# Patient Record
Sex: Female | Born: 1955 | Race: White | Hispanic: No | Marital: Married | Smoking: Former smoker
Health system: Southern US, Community
[De-identification: ages and names within clinical notes are randomized; demographics above are authoritative.]

## PROBLEM LIST (undated history)

## (undated) DIAGNOSIS — I10 Essential (primary) hypertension: Secondary | ICD-10-CM

## (undated) DIAGNOSIS — E039 Hypothyroidism, unspecified: Secondary | ICD-10-CM

## (undated) DIAGNOSIS — I700 Atherosclerosis of aorta: Secondary | ICD-10-CM

## (undated) DIAGNOSIS — D563 Thalassemia minor: Secondary | ICD-10-CM

## (undated) DIAGNOSIS — J449 Chronic obstructive pulmonary disease, unspecified: Secondary | ICD-10-CM

## (undated) DIAGNOSIS — N390 Urinary tract infection, site not specified: Secondary | ICD-10-CM

## (undated) DIAGNOSIS — D649 Anemia, unspecified: Secondary | ICD-10-CM

## (undated) DIAGNOSIS — G2581 Restless legs syndrome: Secondary | ICD-10-CM

## (undated) DIAGNOSIS — N951 Menopausal and female climacteric states: Secondary | ICD-10-CM

## (undated) DIAGNOSIS — G4700 Insomnia, unspecified: Secondary | ICD-10-CM

## (undated) DIAGNOSIS — F1310 Sedative, hypnotic or anxiolytic abuse, uncomplicated: Secondary | ICD-10-CM

## (undated) DIAGNOSIS — F32A Depression, unspecified: Secondary | ICD-10-CM

## (undated) DIAGNOSIS — F329 Major depressive disorder, single episode, unspecified: Secondary | ICD-10-CM

## (undated) DIAGNOSIS — C4492 Squamous cell carcinoma of skin, unspecified: Secondary | ICD-10-CM

## (undated) HISTORY — DX: Atherosclerosis of aorta: I70.0

## (undated) HISTORY — DX: Hypothyroidism, unspecified: E03.9

## (undated) HISTORY — DX: Menopausal and female climacteric states: N95.1

## (undated) HISTORY — DX: Chronic obstructive pulmonary disease, unspecified: J44.9

## (undated) HISTORY — DX: Anemia, unspecified: D64.9

## (undated) HISTORY — PX: OTHER SURGICAL HISTORY: SHX169

## (undated) HISTORY — DX: Essential (primary) hypertension: I10

## (undated) HISTORY — DX: Restless legs syndrome: G25.81

## (undated) HISTORY — DX: Sedative, hypnotic or anxiolytic abuse, uncomplicated: F13.10

## (undated) HISTORY — DX: Urinary tract infection, site not specified: N39.0

## (undated) HISTORY — DX: Depression, unspecified: F32.A

## (undated) HISTORY — DX: Thalassemia minor: D56.3

## (undated) HISTORY — PX: COLONOSCOPY: SHX174

## (undated) HISTORY — DX: Insomnia, unspecified: G47.00

## (undated) HISTORY — DX: Squamous cell carcinoma of skin, unspecified: C44.92

---
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
## 1998-06-25 ENCOUNTER — Other Ambulatory Visit: Admission: RE | Admit: 1998-06-25 | Discharge: 1998-06-25 | Payer: Self-pay | Admitting: Obstetrics & Gynecology

## 1999-01-07 ENCOUNTER — Other Ambulatory Visit: Admission: RE | Admit: 1999-01-07 | Discharge: 1999-01-07 | Payer: Self-pay | Admitting: Obstetrics & Gynecology

## 2000-02-04 ENCOUNTER — Other Ambulatory Visit: Admission: RE | Admit: 2000-02-04 | Discharge: 2000-02-04 | Payer: Self-pay | Admitting: Obstetrics & Gynecology

## 2000-09-01 ENCOUNTER — Other Ambulatory Visit: Admission: RE | Admit: 2000-09-01 | Discharge: 2000-09-01 | Payer: Self-pay | Admitting: Obstetrics & Gynecology

## 2001-02-03 ENCOUNTER — Other Ambulatory Visit: Admission: RE | Admit: 2001-02-03 | Discharge: 2001-02-03 | Payer: Self-pay | Admitting: Obstetrics & Gynecology

## 2001-08-06 ENCOUNTER — Other Ambulatory Visit: Admission: RE | Admit: 2001-08-06 | Discharge: 2001-08-06 | Payer: Self-pay | Admitting: Obstetrics & Gynecology

## 2002-08-23 ENCOUNTER — Other Ambulatory Visit: Admission: RE | Admit: 2002-08-23 | Discharge: 2002-08-23 | Payer: Self-pay | Admitting: Obstetrics & Gynecology

## 2003-09-19 ENCOUNTER — Other Ambulatory Visit: Admission: RE | Admit: 2003-09-19 | Discharge: 2003-09-19 | Payer: Self-pay | Admitting: Obstetrics & Gynecology

## 2004-11-27 ENCOUNTER — Other Ambulatory Visit: Admission: RE | Admit: 2004-11-27 | Discharge: 2004-11-27 | Payer: Self-pay | Admitting: Obstetrics & Gynecology

## 2005-12-18 ENCOUNTER — Other Ambulatory Visit: Admission: RE | Admit: 2005-12-18 | Discharge: 2005-12-18 | Payer: Self-pay | Admitting: Obstetrics & Gynecology

## 2006-08-25 ENCOUNTER — Encounter: Admission: RE | Admit: 2006-08-25 | Discharge: 2006-08-25 | Payer: Self-pay | Admitting: Obstetrics & Gynecology

## 2006-11-25 ENCOUNTER — Encounter: Admission: RE | Admit: 2006-11-25 | Discharge: 2006-11-25 | Payer: Self-pay | Admitting: Obstetrics & Gynecology

## 2007-11-29 ENCOUNTER — Encounter: Admission: RE | Admit: 2007-11-29 | Discharge: 2007-11-29 | Payer: Self-pay | Admitting: Obstetrics & Gynecology

## 2007-12-07 ENCOUNTER — Encounter: Admission: RE | Admit: 2007-12-07 | Discharge: 2007-12-07 | Payer: Self-pay | Admitting: Obstetrics & Gynecology

## 2008-12-07 ENCOUNTER — Encounter: Admission: RE | Admit: 2008-12-07 | Discharge: 2008-12-07 | Payer: Self-pay | Admitting: Obstetrics & Gynecology

## 2009-12-10 ENCOUNTER — Encounter: Admission: RE | Admit: 2009-12-10 | Discharge: 2009-12-10 | Payer: Self-pay | Admitting: Obstetrics & Gynecology

## 2010-10-27 ENCOUNTER — Encounter: Payer: Self-pay | Admitting: Obstetrics & Gynecology

## 2010-12-23 ENCOUNTER — Other Ambulatory Visit: Payer: Self-pay | Admitting: Obstetrics & Gynecology

## 2010-12-23 DIAGNOSIS — Z1231 Encounter for screening mammogram for malignant neoplasm of breast: Secondary | ICD-10-CM

## 2010-12-25 ENCOUNTER — Ambulatory Visit
Admission: RE | Admit: 2010-12-25 | Discharge: 2010-12-25 | Disposition: A | Discharge status: Home / Self Care | Payer: BC Managed Care – PPO | Source: Ambulatory Visit | Attending: Obstetrics & Gynecology | Admitting: Obstetrics & Gynecology

## 2010-12-25 DIAGNOSIS — Z1231 Encounter for screening mammogram for malignant neoplasm of breast: Secondary | ICD-10-CM

## 2010-12-27 ENCOUNTER — Other Ambulatory Visit: Payer: Self-pay | Admitting: Obstetrics & Gynecology

## 2010-12-27 DIAGNOSIS — R928 Other abnormal and inconclusive findings on diagnostic imaging of breast: Secondary | ICD-10-CM

## 2011-01-03 ENCOUNTER — Ambulatory Visit
Admission: RE | Admit: 2011-01-03 | Discharge: 2011-01-03 | Disposition: A | Discharge status: Home / Self Care | Payer: BC Managed Care – PPO | Source: Ambulatory Visit | Attending: Obstetrics & Gynecology | Admitting: Obstetrics & Gynecology

## 2011-01-03 DIAGNOSIS — R928 Other abnormal and inconclusive findings on diagnostic imaging of breast: Secondary | ICD-10-CM

## 2011-12-01 ENCOUNTER — Other Ambulatory Visit: Payer: Self-pay | Admitting: Obstetrics & Gynecology

## 2011-12-01 DIAGNOSIS — Z1231 Encounter for screening mammogram for malignant neoplasm of breast: Secondary | ICD-10-CM

## 2012-01-28 ENCOUNTER — Ambulatory Visit
Admission: RE | Admit: 2012-01-28 | Discharge: 2012-01-28 | Disposition: A | Discharge status: Home / Self Care | Payer: BC Managed Care – PPO | Source: Ambulatory Visit | Attending: Obstetrics & Gynecology | Admitting: Obstetrics & Gynecology

## 2012-01-28 DIAGNOSIS — Z1231 Encounter for screening mammogram for malignant neoplasm of breast: Secondary | ICD-10-CM

## 2013-01-03 ENCOUNTER — Other Ambulatory Visit: Payer: Self-pay

## 2013-01-03 DIAGNOSIS — Z1231 Encounter for screening mammogram for malignant neoplasm of breast: Secondary | ICD-10-CM

## 2013-01-28 ENCOUNTER — Ambulatory Visit
Admission: RE | Admit: 2013-01-28 | Discharge: 2013-01-28 | Disposition: A | Discharge status: Home / Self Care | Payer: BC Managed Care – PPO | Source: Ambulatory Visit

## 2013-01-28 DIAGNOSIS — Z1231 Encounter for screening mammogram for malignant neoplasm of breast: Secondary | ICD-10-CM

## 2013-04-19 DIAGNOSIS — C4491 Basal cell carcinoma of skin, unspecified: Secondary | ICD-10-CM

## 2013-04-19 HISTORY — DX: Basal cell carcinoma of skin, unspecified: C44.91

## 2013-09-13 ENCOUNTER — Other Ambulatory Visit: Payer: Self-pay | Admitting: Obstetrics & Gynecology

## 2013-09-13 DIAGNOSIS — N63 Unspecified lump in unspecified breast: Secondary | ICD-10-CM

## 2013-09-22 ENCOUNTER — Ambulatory Visit
Admission: RE | Admit: 2013-09-22 | Discharge: 2013-09-22 | Disposition: A | Discharge status: Home / Self Care | Payer: BC Managed Care – PPO | Source: Ambulatory Visit | Attending: Obstetrics & Gynecology | Admitting: Obstetrics & Gynecology

## 2013-09-22 DIAGNOSIS — N63 Unspecified lump in unspecified breast: Secondary | ICD-10-CM

## 2013-10-25 ENCOUNTER — Other Ambulatory Visit: Payer: Self-pay | Admitting: Obstetrics & Gynecology

## 2013-10-25 DIAGNOSIS — R922 Inconclusive mammogram: Secondary | ICD-10-CM

## 2013-10-25 DIAGNOSIS — Z803 Family history of malignant neoplasm of breast: Secondary | ICD-10-CM

## 2013-11-01 ENCOUNTER — Ambulatory Visit
Admission: RE | Admit: 2013-11-01 | Discharge: 2013-11-01 | Disposition: A | Discharge status: Home / Self Care | Payer: BC Managed Care – PPO | Source: Ambulatory Visit | Attending: Obstetrics & Gynecology | Admitting: Obstetrics & Gynecology

## 2013-11-01 DIAGNOSIS — R922 Inconclusive mammogram: Secondary | ICD-10-CM

## 2013-11-01 DIAGNOSIS — Z803 Family history of malignant neoplasm of breast: Secondary | ICD-10-CM

## 2013-11-01 MED ORDER — GADOBENATE DIMEGLUMINE 529 MG/ML IV SOLN
11.0000 mL | Freq: Once | INTRAVENOUS | Status: AC | PRN
  Administered 2013-11-01: 11 mL via INTRAVENOUS

## 2014-08-25 ENCOUNTER — Other Ambulatory Visit: Payer: Self-pay

## 2014-08-25 DIAGNOSIS — Z1231 Encounter for screening mammogram for malignant neoplasm of breast: Secondary | ICD-10-CM

## 2014-09-26 ENCOUNTER — Ambulatory Visit
Admission: RE | Admit: 2014-09-26 | Discharge: 2014-09-26 | Disposition: A | Discharge status: Home / Self Care | Payer: BC Managed Care – PPO | Source: Ambulatory Visit

## 2014-09-26 DIAGNOSIS — Z1231 Encounter for screening mammogram for malignant neoplasm of breast: Secondary | ICD-10-CM

## 2015-11-08 ENCOUNTER — Other Ambulatory Visit: Payer: Self-pay

## 2015-11-08 DIAGNOSIS — Z1231 Encounter for screening mammogram for malignant neoplasm of breast: Secondary | ICD-10-CM

## 2015-11-20 ENCOUNTER — Ambulatory Visit
Admission: RE | Admit: 2015-11-20 | Discharge: 2015-11-20 | Disposition: A | Discharge status: Home / Self Care | Payer: BLUE CROSS/BLUE SHIELD | Source: Ambulatory Visit

## 2015-11-20 DIAGNOSIS — Z1231 Encounter for screening mammogram for malignant neoplasm of breast: Secondary | ICD-10-CM

## 2016-08-22 ENCOUNTER — Other Ambulatory Visit: Payer: Self-pay | Admitting: Family Medicine

## 2016-08-22 ENCOUNTER — Ambulatory Visit
Admission: RE | Admit: 2016-08-22 | Discharge: 2016-08-22 | Disposition: A | Discharge status: Home / Self Care | Payer: BLUE CROSS/BLUE SHIELD | Source: Ambulatory Visit | Attending: Family Medicine | Admitting: Family Medicine

## 2016-08-22 DIAGNOSIS — R059 Cough, unspecified: Secondary | ICD-10-CM

## 2016-08-22 DIAGNOSIS — R05 Cough: Secondary | ICD-10-CM

## 2016-10-16 ENCOUNTER — Other Ambulatory Visit: Payer: Self-pay | Admitting: Obstetrics & Gynecology

## 2016-10-16 DIAGNOSIS — Z1231 Encounter for screening mammogram for malignant neoplasm of breast: Secondary | ICD-10-CM

## 2016-11-20 ENCOUNTER — Ambulatory Visit
Admission: RE | Admit: 2016-11-20 | Discharge: 2016-11-20 | Disposition: A | Discharge status: Home / Self Care | Payer: BLUE CROSS/BLUE SHIELD | Source: Ambulatory Visit | Attending: Obstetrics & Gynecology | Admitting: Obstetrics & Gynecology

## 2016-11-20 DIAGNOSIS — Z1231 Encounter for screening mammogram for malignant neoplasm of breast: Secondary | ICD-10-CM

## 2017-10-13 ENCOUNTER — Other Ambulatory Visit: Payer: Self-pay | Admitting: Obstetrics & Gynecology

## 2017-10-13 DIAGNOSIS — Z1231 Encounter for screening mammogram for malignant neoplasm of breast: Secondary | ICD-10-CM

## 2017-11-23 ENCOUNTER — Ambulatory Visit
Admission: RE | Admit: 2017-11-23 | Discharge: 2017-11-23 | Disposition: A | Discharge status: Home / Self Care | Payer: BLUE CROSS/BLUE SHIELD | Source: Ambulatory Visit | Attending: Obstetrics & Gynecology | Admitting: Obstetrics & Gynecology

## 2017-11-23 DIAGNOSIS — Z1231 Encounter for screening mammogram for malignant neoplasm of breast: Secondary | ICD-10-CM

## 2018-10-15 ENCOUNTER — Other Ambulatory Visit: Payer: Self-pay | Admitting: Obstetrics & Gynecology

## 2018-10-15 DIAGNOSIS — Z1231 Encounter for screening mammogram for malignant neoplasm of breast: Secondary | ICD-10-CM

## 2018-11-24 ENCOUNTER — Ambulatory Visit
Admission: RE | Admit: 2018-11-24 | Discharge: 2018-11-24 | Disposition: A | Discharge status: Home / Self Care | Payer: PRIVATE HEALTH INSURANCE | Source: Ambulatory Visit | Attending: Obstetrics & Gynecology | Admitting: Obstetrics & Gynecology

## 2018-11-24 DIAGNOSIS — Z1231 Encounter for screening mammogram for malignant neoplasm of breast: Secondary | ICD-10-CM

## 2019-08-22 ENCOUNTER — Other Ambulatory Visit: Payer: Self-pay | Admitting: Family Medicine

## 2019-08-22 DIAGNOSIS — E2839 Other primary ovarian failure: Secondary | ICD-10-CM

## 2019-09-13 ENCOUNTER — Other Ambulatory Visit: Payer: Self-pay

## 2019-09-13 ENCOUNTER — Ambulatory Visit
Admission: RE | Admit: 2019-09-13 | Discharge: 2019-09-13 | Disposition: A | Discharge status: Home / Self Care | Payer: Medicare Other | Source: Ambulatory Visit | Attending: Family Medicine | Admitting: Family Medicine

## 2019-09-13 DIAGNOSIS — E2839 Other primary ovarian failure: Secondary | ICD-10-CM

## 2019-10-18 NOTE — Progress Notes (Signed)
Cardiology Office Note:    Date:  10/19/2019   ID:  Cheryl Burgess, DOB 05-19-1956, MRN [id number]  PCP:  Mayra Neer, MD  Cardiologist:  No primary care provider on file.   Referring MD: Mayra Neer, MD   Chief Complaint  Patient presents with  . Shortness of Breath  . Coronary Artery Disease  . Advice Only    Lifetime history of murmur    History of Present Illness:    Cheryl Burgess is a 64 y.o. female with a hx of DOE who is referred by Mayra Neer, MD.  The patient is well-known to me through her son and husband who are patients with ischemic heart disease that I have taken care of for greater than 25 years.  She is referred by Dr. Brigitte Pulse because of dyspnea on exertion.  She has a prior longstanding history of smoking.  She discontinued smoking greater than 20 years ago.  She does carry a history of COPD.  Over the past year or 2 she has noted progressive dyspnea on exertion.  She denies dyspnea at rest.  She does breathe better if she sleeps propped up on pillows.  She occasionally has lower extremity swelling.  She denies chest discomfort of any sort.  There is no exertional discomfort related to the shortness of breath.  Her problem list from [place] carries a diagnosis of aortic atherosclerosis.  I am unable to find the source.  Risk factors for vascular disease include a family history (father had vascular disease/CHF/AICD but lived to be 98; her grandmother on the father's side had heart failure and myocardial infarction), mild hyperlipidemia, prior smoking history, but no mention of glucose intolerance.  Past Medical History:  Diagnosis Date  . Ambien use disorder, mild, abuse ([place])   . Anemia, unspecified   . Atherosclerosis of aorta ([place])   . Benzodiazepine abuse ([place])   . COPD (chronic obstructive pulmonary disease) ([place])   . Depression   . Hypertension   . Hypothyroidism   . Insomnia   . Menopausal syndrome (hot flashes)   . Recurrent UTI    . RLS (restless legs syndrome)   . Superficial basal cell carcinoma (BCC) 04/19/2013   Post Right Knee (Cx3,5FU)  . Thalassemia minor     Past Surgical History:  Procedure Laterality Date  . calf implants    . COLONOSCOPY      Current Medications: Current Meds  Medication Sig  . albuterol (VENTOLIN HFA) 108 (90 Base) MCG/ACT inhaler Inhale 2 puffs into the lungs every 6 (six) hours as needed for wheezing or shortness of breath.  . ALOE PO Take by mouth.  . ALPRAZolam (XANAX) 0.5 MG tablet Take 0.5 mg by mouth daily.  . Cholecalciferol (VITAMIN D3) 1.25 MG (50000 UT) CAPS Take by mouth.  . ciprofloxacin (CIPRO) 500 MG tablet Take 500 mg by mouth 2 (two) times daily.  Marland Kitchen estradiol (ESTRACE) 0.5 MG tablet Take 0.5 mg by mouth daily.  Marland Kitchen gabapentin (NEURONTIN) 600 MG tablet Take 600 mg by mouth 3 (three) times daily.  Marland Kitchen imipramine (TOFRANIL) 50 MG tablet Take 50 mg by mouth at bedtime.  Marland Kitchen levothyroxine (SYNTHROID) 75 MCG tablet Take 75 mcg by mouth daily before breakfast.  . losartan-hydrochlorothiazide (HYZAAR) 100-25 MG tablet Take 1 tablet by mouth daily.  Marland Kitchen rOPINIRole (REQUIP) 3 MG tablet Take 3 mg by mouth at bedtime.     Allergies:   Keflex [cephalexin]   Social History   Socioeconomic History  . Marital  status: Married    Spouse name: Not on file  . Number of children: Not on file  . Years of education: Not on file  . Highest education level: Not on file  Occupational History  . Not on file  Tobacco Use  . Smoking status: Former Smoker    Packs/day: 2.00    Years: 30.00    Pack years: 60.00    Quit date: 2000    Years since quitting: 21.0  . Smokeless tobacco: Never Used  Substance and Sexual Activity  . Alcohol use: Never  . Drug use: Never  . Sexual activity: Not on file  Other Topics Concern  . Not on file  Social History Narrative  . Not on file   Social Determinants of Health   Financial Resource Strain:   . Difficulty of Paying Living Expenses: Not  on file  Food Insecurity:   . Worried About Charity fundraiser in the Last Year: Not on file  . Ran Out of Food in the Last Year: Not on file  Transportation Needs:   . Lack of Transportation (Medical): Not on file  . Lack of Transportation (Non-Medical): Not on file  Physical Activity:   . Days of Exercise per Week: Not on file  . Minutes of Exercise per Session: Not on file  Stress:   . Feeling of Stress : Not on file  Social Connections:   . Frequency of Communication with Friends and Family: Not on file  . Frequency of Social Gatherings with Friends and Family: Not on file  . Attends Religious Services: Not on file  . Active Member of Clubs or Organizations: Not on file  . Attends Archivist Meetings: Not on file  . Marital Status: Not on file     Family History: The patient's family history includes Alzheimer's disease in her mother; CAD in her father; Cancer in her mother; Dementia in her mother; Diabetes in her father; Hypertension in her father; Kidney disease in her brother; Macular degeneration in her mother; Osteoporosis in her mother; Parkinson's disease in her mother. There is no history of Breast cancer.  ROS:   Please see the history of present illness.    She really has no major complaints.  "I wonder why I am here" all other systems reviewed and are negative.  EKGs/Labs/Other Studies Reviewed:    The following studies were reviewed today:  Lipid panel from [facility] November 2020: Total cholesterol 186; HDL 77; LDL 88; triglyceride 102.  No cardiac imaging or evaluation  EKG:  EKG EKG is abnormal in the sense that she has diffuse ST abnormality inferolateral leads.  No prior tracing to compare.  Recent Labs: No results found for requested labs within last 8760 hours.  Recent Lipid Panel No results found for: CHOL, TRIG, HDL, CHOLHDL, VLDL, LDLCALC, LDLDIRECT  Physical Exam:    VS:  BP 116/64   Pulse 67   Ht 5\' 3"  (1.6 m)   Wt 133 lb (60.3 kg)    LMP 12/31/2012   BMI 23.56 kg/m     Wt Readings from Last 3 Encounters:  10/19/19 133 lb (60.3 kg)     GEN: Appears younger than the stated age. No acute distress HEENT: Normal NECK: No JVD. LYMPHATICS: No lymphadenopathy CARDIAC: Left left upper sternal and right upper sternal border systolic murmur.  RRR without diastolic murmur, gallop, or edema. VASCULAR:  Normal Pulses. No bruits. RESPIRATORY:  Clear to auscultation without rales, wheezing or rhonchi  ABDOMEN: Soft, non-tender, non-distended, No pulsatile mass, MUSCULOSKELETAL: No deformity  SKIN: Warm and dry NEUROLOGIC:  Alert and oriented x 3 PSYCHIATRIC:  Normal affect   ASSESSMENT:    1. DOE (dyspnea on exertion)   2. Aortic atherosclerosis ([place])   3. Centrilobular emphysema ([place])   4. Hyperlipidemia LDL goal <70   5. SOB (shortness of breath)   6. Educated about COVID-19 virus infection    PLAN:    In order of problems listed above:  1. Probably related to lung disease.  Rule out anginal equivalent.  Plan to Doppler echocardiogram to assess left and right heart systolic function.  BNP level. 2. Coronary calcium score will be done. 3. Being managed by Dr. Brigitte Pulse 4. If significant coronary calcification will need statin therapy 5. Differentiate between lung versus cardiac etiology 6. 3W's and COVID-19 vaccine are advocated.  As needed follow-up based on findings.   Medication Adjustments/Labs and Tests Ordered: Current medicines are reviewed at length with the patient today.  Concerns regarding medicines are outlined above.  Orders Placed This Encounter  Procedures  . CT CARDIAC SCORING  . Pro b natriuretic peptide  . EKG 12-Lead  . ECHOCARDIOGRAM COMPLETE   No orders of the defined types were placed in this encounter.   Patient Instructions  Medication Instructions:  Your physician recommends that you continue on your current medications as directed. Please refer to the Current Medication list given  to you today.  *If you need a refill on your cardiac medications before your next appointment, please call your pharmacy*  Lab Work: Pro BNP today  If you have labs (blood work) drawn today and your tests are completely normal, you will receive your results only by: Marland Kitchen MyChart Message (if you have MyChart) OR . A paper copy in the mail If you have any lab test that is abnormal or we need to change your treatment, we will call you to review the results.  Testing/Procedures: Your physician has requested that you have an echocardiogram. Echocardiography is a painless test that uses sound waves to create images of your heart. It provides your doctor with information about the size and shape of your heart and how well your heart's chambers and valves are working. This procedure takes approximately one hour. There are no restrictions for this procedure.  Your physician recommends that you have a Calcium Score performed.   Follow-Up: At [facility], you and your health needs are our priority.  As part of our continuing mission to provide you with exceptional heart care, we have created designated Provider Care Teams.  These Care Teams include your primary Cardiologist (physician) and Advanced Practice Providers (APPs -  Physician Assistants and Nurse Practitioners) who all work together to provide you with the care you need, when you need it.  Your next appointment:   As needed  The format for your next appointment:   In Person  Provider:   You may see Dr. Daneen Schick or one of the following Advanced Practice Providers on your designated Care Team:    Truitt Merle, NP  Cecilie Kicks, NP  Kathyrn Drown, NP   Other Instructions      Signed, Sinclair Grooms, MD  10/19/2019 10:29 AM    [place]

## 2019-10-19 ENCOUNTER — Encounter: Payer: Self-pay | Admitting: Interventional Cardiology

## 2019-10-19 ENCOUNTER — Ambulatory Visit (INDEPENDENT_AMBULATORY_CARE_PROVIDER_SITE_OTHER): Payer: Medicare Other | Admitting: Interventional Cardiology

## 2019-10-19 ENCOUNTER — Ambulatory Visit: Payer: Medicare Other | Attending: Internal Medicine

## 2019-10-19 ENCOUNTER — Other Ambulatory Visit: Payer: Self-pay

## 2019-10-19 VITALS — BP 116/64 | HR 67 | Ht 63.0 in | Wt 133.0 lb

## 2019-10-19 DIAGNOSIS — I700 Atherosclerosis of aorta: Secondary | ICD-10-CM

## 2019-10-19 DIAGNOSIS — J432 Centrilobular emphysema: Secondary | ICD-10-CM | POA: Diagnosis not present

## 2019-10-19 DIAGNOSIS — Z20822 Contact with and (suspected) exposure to covid-19: Secondary | ICD-10-CM

## 2019-10-19 DIAGNOSIS — R0602 Shortness of breath: Secondary | ICD-10-CM

## 2019-10-19 DIAGNOSIS — E785 Hyperlipidemia, unspecified: Secondary | ICD-10-CM | POA: Diagnosis not present

## 2019-10-19 DIAGNOSIS — R0600 Dyspnea, unspecified: Secondary | ICD-10-CM | POA: Diagnosis not present

## 2019-10-19 DIAGNOSIS — R0609 Other forms of dyspnea: Secondary | ICD-10-CM

## 2019-10-19 DIAGNOSIS — Z7189 Other specified counseling: Secondary | ICD-10-CM

## 2019-10-19 LAB — PRO B NATRIURETIC PEPTIDE: NT-Pro BNP: 121 pg/mL (ref 0–301)

## 2019-10-19 NOTE — Patient Instructions (Signed)
Medication Instructions:  Your physician recommends that you continue on your current medications as directed. Please refer to the Current Medication list given to you today.  *If you need a refill on your cardiac medications before your next appointment, please call your pharmacy*  Lab Work: Pro BNP today  If you have labs (blood work) drawn today and your tests are completely normal, you will receive your results only by: Marland Kitchen MyChart Message (if you have MyChart) OR . A paper copy in the mail If you have any lab test that is abnormal or we need to change your treatment, we will call you to review the results.  Testing/Procedures: Your physician has requested that you have an echocardiogram. Echocardiography is a painless test that uses sound waves to create images of your heart. It provides your doctor with information about the size and shape of your heart and how well your heart's chambers and valves are working. This procedure takes approximately one hour. There are no restrictions for this procedure.  Your physician recommends that you have a Calcium Score performed.   Follow-Up: At [facility], you and your health needs are our priority.  As part of our continuing mission to provide you with exceptional heart care, we have created designated Provider Care Teams.  These Care Teams include your primary Cardiologist (physician) and Advanced Practice Providers (APPs -  Physician Assistants and Nurse Practitioners) who all work together to provide you with the care you need, when you need it.  Your next appointment:   As needed  The format for your next appointment:   In Person  Provider:   You may see Dr. Daneen Schick or one of the following Advanced Practice Providers on your designated Care Team:    Truitt Merle, NP  Cecilie Kicks, NP  Kathyrn Drown, NP   Other Instructions

## 2019-10-21 ENCOUNTER — Other Ambulatory Visit: Payer: Self-pay | Admitting: Obstetrics & Gynecology

## 2019-10-21 DIAGNOSIS — Z1231 Encounter for screening mammogram for malignant neoplasm of breast: Secondary | ICD-10-CM

## 2019-10-21 LAB — NOVEL CORONAVIRUS, NAA: SARS-CoV-2, NAA: NOT DETECTED

## 2019-11-01 ENCOUNTER — Other Ambulatory Visit: Payer: Self-pay

## 2019-11-01 ENCOUNTER — Ambulatory Visit (HOSPITAL_COMMUNITY): Payer: Medicare Other | Attending: Cardiovascular Disease

## 2019-11-01 ENCOUNTER — Ambulatory Visit (INDEPENDENT_AMBULATORY_CARE_PROVIDER_SITE_OTHER)
Admission: RE | Admit: 2019-11-01 | Discharge: 2019-11-01 | Disposition: A | Discharge status: Home / Self Care | Payer: Self-pay | Source: Ambulatory Visit | Attending: Interventional Cardiology | Admitting: Interventional Cardiology

## 2019-11-01 DIAGNOSIS — R0609 Other forms of dyspnea: Secondary | ICD-10-CM

## 2019-11-01 DIAGNOSIS — I700 Atherosclerosis of aorta: Secondary | ICD-10-CM | POA: Diagnosis present

## 2019-11-01 DIAGNOSIS — R0600 Dyspnea, unspecified: Secondary | ICD-10-CM | POA: Diagnosis present

## 2019-11-01 DIAGNOSIS — R0602 Shortness of breath: Secondary | ICD-10-CM | POA: Diagnosis present

## 2019-11-02 ENCOUNTER — Telehealth: Payer: Self-pay | Admitting: *Deleted

## 2019-11-02 DIAGNOSIS — E785 Hyperlipidemia, unspecified: Secondary | ICD-10-CM

## 2019-11-02 MED ORDER — ROSUVASTATIN CALCIUM 5 MG PO TABS: 5.0000 mg | ORAL_TABLET | Freq: Every day | ORAL | 3 refills | Status: DC

## 2019-11-02 NOTE — Telephone Encounter (Signed)
Spoke with pt and reviewed results and recommendations.  Pt verbalized understanding and was in agreement with plan.  She will come for labs on 12/15/19.

## 2019-11-02 NOTE — Telephone Encounter (Signed)
-----   Message from Belva Crome, MD sent at 11/01/2019  8:28 PM EST ----- Let the patient know the coronary calcium score shows mild plaue buildup. Please start Rosuvastatin 5 mg daily. Liver and lipid in 6 weeks. LDL target < 70 A copy will be sent to Mayra Neer, MD

## 2019-11-25 ENCOUNTER — Other Ambulatory Visit: Payer: Self-pay | Admitting: Physician Assistant

## 2019-11-25 DIAGNOSIS — R1111 Vomiting without nausea: Secondary | ICD-10-CM

## 2019-11-25 DIAGNOSIS — R1013 Epigastric pain: Secondary | ICD-10-CM

## 2019-11-29 ENCOUNTER — Other Ambulatory Visit: Payer: Self-pay

## 2019-11-29 ENCOUNTER — Ambulatory Visit
Admission: RE | Admit: 2019-11-29 | Discharge: 2019-11-29 | Disposition: A | Discharge status: Home / Self Care | Payer: Medicare Other | Source: Ambulatory Visit | Attending: Obstetrics & Gynecology | Admitting: Obstetrics & Gynecology

## 2019-11-29 DIAGNOSIS — Z1231 Encounter for screening mammogram for malignant neoplasm of breast: Secondary | ICD-10-CM

## 2019-12-01 ENCOUNTER — Ambulatory Visit
Admission: RE | Admit: 2019-12-01 | Discharge: 2019-12-01 | Disposition: A | Discharge status: Home / Self Care | Payer: Medicare Other | Source: Ambulatory Visit | Attending: Physician Assistant | Admitting: Physician Assistant

## 2019-12-01 DIAGNOSIS — R1013 Epigastric pain: Secondary | ICD-10-CM

## 2019-12-01 DIAGNOSIS — R1111 Vomiting without nausea: Secondary | ICD-10-CM

## 2019-12-02 ENCOUNTER — Other Ambulatory Visit: Payer: Self-pay | Admitting: Family Medicine

## 2019-12-02 DIAGNOSIS — R222 Localized swelling, mass and lump, trunk: Secondary | ICD-10-CM

## 2019-12-07 ENCOUNTER — Ambulatory Visit
Admission: RE | Admit: 2019-12-07 | Discharge: 2019-12-07 | Disposition: A | Discharge status: Home / Self Care | Payer: Medicare Other | Source: Ambulatory Visit | Attending: Family Medicine | Admitting: Family Medicine

## 2019-12-07 DIAGNOSIS — R222 Localized swelling, mass and lump, trunk: Secondary | ICD-10-CM

## 2019-12-07 NOTE — Telephone Encounter (Signed)
Called patient to inform her of the following recommendations from Dr. Tamala Julian. She verbalized understanding and is aware to call back with any new concerns.   Try Rosuvastatin 5 mg M, W, F and keep lab appointment. If she does not resume medication, no need to do blood work.

## 2019-12-15 ENCOUNTER — Other Ambulatory Visit: Payer: Medicare Other

## 2019-12-26 ENCOUNTER — Other Ambulatory Visit: Payer: Self-pay | Admitting: *Deleted

## 2019-12-26 MED ORDER — EZETIMIBE 10 MG PO TABS: 10.0000 mg | ORAL_TABLET | Freq: Every day | ORAL | 3 refills | Status: DC

## 2019-12-30 ENCOUNTER — Other Ambulatory Visit: Payer: Medicare Other

## 2020-01-30 ENCOUNTER — Other Ambulatory Visit: Payer: Self-pay | Admitting: Physician Assistant

## 2020-01-30 ENCOUNTER — Other Ambulatory Visit (HOSPITAL_COMMUNITY): Payer: Self-pay | Admitting: Physician Assistant

## 2020-01-30 DIAGNOSIS — R932 Abnormal findings on diagnostic imaging of liver and biliary tract: Secondary | ICD-10-CM

## 2020-02-07 ENCOUNTER — Other Ambulatory Visit: Payer: Medicare Other

## 2020-02-07 ENCOUNTER — Other Ambulatory Visit: Payer: Self-pay

## 2020-02-07 DIAGNOSIS — E785 Hyperlipidemia, unspecified: Secondary | ICD-10-CM

## 2020-02-07 LAB — LIPID PANEL
Chol/HDL Ratio: 2.1 ratio (ref 0.0–4.4)
Cholesterol, Total: 147 mg/dL (ref 100–199)
HDL: 69 mg/dL (ref >39)
LDL Chol Calc (NIH): 65 mg/dL (ref 0–99)
Triglycerides: 65 mg/dL (ref 0–149)
VLDL Cholesterol Cal: 13 mg/dL (ref 5–40)

## 2020-02-07 LAB — HEPATIC FUNCTION PANEL
ALT: 17 IU/L (ref 0–32)
AST: 19 IU/L (ref 0–40)
Albumin: 4.2 g/dL (ref 3.8–4.8)
Alkaline Phosphatase: 86 IU/L (ref 39–117)
Bilirubin Total: 0.5 mg/dL (ref 0.0–1.2)
Bilirubin, Direct: 0.13 mg/dL (ref 0.00–0.40)
Total Protein: 6.9 g/dL (ref 6.0–8.5)

## 2020-02-10 ENCOUNTER — Encounter (HOSPITAL_COMMUNITY)
Admission: RE | Admit: 2020-02-10 | Discharge: 2020-02-10 | Disposition: A | Discharge status: Home / Self Care | Payer: Medicare Other | Source: Ambulatory Visit | Attending: Physician Assistant | Admitting: Physician Assistant

## 2020-02-10 ENCOUNTER — Other Ambulatory Visit: Payer: Self-pay

## 2020-02-10 DIAGNOSIS — R932 Abnormal findings on diagnostic imaging of liver and biliary tract: Secondary | ICD-10-CM | POA: Diagnosis not present

## 2020-02-10 MED ORDER — TECHNETIUM TC 99M MEBROFENIN IV KIT
5.4000 | PACK | Freq: Once | INTRAVENOUS | Status: AC | PRN
  Administered 2020-02-10: 5.4 via INTRAVENOUS

## 2020-02-10 MED ORDER — MORPHINE SULFATE (PF) 4 MG/ML IV SOLN: 3.0000 mg | Freq: Once | INTRAVENOUS | Status: AC

## 2020-02-10 MED ORDER — MORPHINE SULFATE (PF) 4 MG/ML IV SOLN
INTRAVENOUS | Status: AC
  Administered 2020-02-10: 3 mg via INTRAVENOUS
  Filled 2020-02-10: qty 1

## 2020-03-20 ENCOUNTER — Other Ambulatory Visit: Payer: Self-pay

## 2020-03-20 ENCOUNTER — Ambulatory Visit (HOSPITAL_COMMUNITY)
Admission: EM | Admit: 2020-03-20 | Discharge: 2020-03-20 | Disposition: A | Discharge status: Home / Self Care | Payer: Medicare Other | Attending: Psychiatry | Admitting: Psychiatry

## 2020-03-20 DIAGNOSIS — R45851 Suicidal ideations: Secondary | ICD-10-CM | POA: Diagnosis not present

## 2020-03-20 DIAGNOSIS — F4323 Adjustment disorder with mixed anxiety and depressed mood: Secondary | ICD-10-CM

## 2020-03-20 DIAGNOSIS — F331 Major depressive disorder, recurrent, moderate: Secondary | ICD-10-CM | POA: Diagnosis not present

## 2020-03-20 DIAGNOSIS — Z915 Personal history of self-harm: Secondary | ICD-10-CM | POA: Insufficient documentation

## 2020-03-20 DIAGNOSIS — F329 Major depressive disorder, single episode, unspecified: Secondary | ICD-10-CM | POA: Diagnosis not present
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
## 2020-03-20 NOTE — Discharge Instructions (Signed)
**  You have been referred to [facility] Intensive Outpatient program (IOP).  If you don't hear from Dellia Nims by 10 AM tomorrow morning, please call the outpatient clinic at [phone].    If you're not accepted to this program within the next week, consider following up with the outpatient psychiatrist with this same [facility] clinic.   You may also consider the following clinic for psychiatry/medication management:  [facility] - in person and virtual appointments offered [place], [place], [place] [address] [phone]

## 2020-03-20 NOTE — ED Notes (Signed)
Pt discharged in no acute distress. Denies SI/HI upon discharge. Verbalized understanding of all discharge instructions received from RN. All belongings returned to patient intact. Pt/spouse escorted to lobby via staff with discharge instructions in hand. Safety maintained.

## 2020-03-20 NOTE — ED Notes (Signed)
Patient belongings accepted and place in locker # 28. Patient is in room #125
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
## 2020-03-26 ENCOUNTER — Telehealth (HOSPITAL_COMMUNITY): Payer: Self-pay | Admitting: Psychiatry

## 2020-03-26 NOTE — Telephone Encounter (Signed)
D:  Levada Dy (TTS) referred pt to [facility].  A:  Placed call to patient and oriented.  Pt will have CCA done tomorrow and start on 03-28-20.  R:  Patient receptive.

## 2020-03-27 ENCOUNTER — Other Ambulatory Visit: Payer: Self-pay

## 2020-03-27 ENCOUNTER — Other Ambulatory Visit (HOSPITAL_COMMUNITY): Payer: Medicare Other | Attending: Psychiatry | Admitting: Psychiatry

## 2020-03-27 DIAGNOSIS — J449 Chronic obstructive pulmonary disease, unspecified: Secondary | ICD-10-CM | POA: Insufficient documentation

## 2020-03-27 DIAGNOSIS — F332 Major depressive disorder, recurrent severe without psychotic features: Secondary | ICD-10-CM | POA: Insufficient documentation

## 2020-03-27 DIAGNOSIS — G2581 Restless legs syndrome: Secondary | ICD-10-CM | POA: Insufficient documentation

## 2020-03-27 DIAGNOSIS — Z915 Personal history of self-harm: Secondary | ICD-10-CM | POA: Insufficient documentation

## 2020-03-27 DIAGNOSIS — Z87891 Personal history of nicotine dependence: Secondary | ICD-10-CM | POA: Insufficient documentation

## 2020-03-27 DIAGNOSIS — Z85828 Personal history of other malignant neoplasm of skin: Secondary | ICD-10-CM | POA: Insufficient documentation

## 2020-03-27 DIAGNOSIS — I10 Essential (primary) hypertension: Secondary | ICD-10-CM | POA: Insufficient documentation

## 2020-03-27 DIAGNOSIS — Z79899 Other long term (current) drug therapy: Secondary | ICD-10-CM | POA: Insufficient documentation

## 2020-03-27 DIAGNOSIS — Z881 Allergy status to other antibiotic agents status: Secondary | ICD-10-CM | POA: Insufficient documentation

## 2020-03-27 DIAGNOSIS — E039 Hypothyroidism, unspecified: Secondary | ICD-10-CM | POA: Insufficient documentation

## 2020-03-27 DIAGNOSIS — R45851 Suicidal ideations: Secondary | ICD-10-CM | POA: Insufficient documentation

## 2020-03-27 DIAGNOSIS — D563 Thalassemia minor: Secondary | ICD-10-CM | POA: Insufficient documentation

## 2020-03-27 DIAGNOSIS — F419 Anxiety disorder, unspecified: Secondary | ICD-10-CM | POA: Insufficient documentation

## 2020-03-27 DIAGNOSIS — G4700 Insomnia, unspecified: Secondary | ICD-10-CM | POA: Insufficient documentation
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
## 2020-03-28 ENCOUNTER — Other Ambulatory Visit (HOSPITAL_COMMUNITY): Payer: Medicare Other | Admitting: Psychiatry

## 2020-03-28 ENCOUNTER — Encounter (HOSPITAL_COMMUNITY): Payer: Self-pay | Admitting: Psychiatry

## 2020-03-28 ENCOUNTER — Other Ambulatory Visit: Payer: Self-pay

## 2020-03-28 DIAGNOSIS — J449 Chronic obstructive pulmonary disease, unspecified: Secondary | ICD-10-CM | POA: Diagnosis not present

## 2020-03-28 DIAGNOSIS — Z915 Personal history of self-harm: Secondary | ICD-10-CM | POA: Diagnosis not present

## 2020-03-28 DIAGNOSIS — R45851 Suicidal ideations: Secondary | ICD-10-CM | POA: Diagnosis not present

## 2020-03-28 DIAGNOSIS — Z79899 Other long term (current) drug therapy: Secondary | ICD-10-CM | POA: Diagnosis not present

## 2020-03-28 DIAGNOSIS — I10 Essential (primary) hypertension: Secondary | ICD-10-CM | POA: Diagnosis not present

## 2020-03-28 DIAGNOSIS — Z85828 Personal history of other malignant neoplasm of skin: Secondary | ICD-10-CM | POA: Diagnosis not present

## 2020-03-28 DIAGNOSIS — G2581 Restless legs syndrome: Secondary | ICD-10-CM | POA: Diagnosis not present

## 2020-03-28 DIAGNOSIS — F332 Major depressive disorder, recurrent severe without psychotic features: Secondary | ICD-10-CM | POA: Diagnosis not present

## 2020-03-28 DIAGNOSIS — G4700 Insomnia, unspecified: Secondary | ICD-10-CM | POA: Diagnosis not present

## 2020-03-28 DIAGNOSIS — Z87891 Personal history of nicotine dependence: Secondary | ICD-10-CM | POA: Diagnosis not present

## 2020-03-28 DIAGNOSIS — E039 Hypothyroidism, unspecified: Secondary | ICD-10-CM | POA: Diagnosis not present

## 2020-03-28 DIAGNOSIS — F419 Anxiety disorder, unspecified: Secondary | ICD-10-CM | POA: Diagnosis not present

## 2020-03-28 DIAGNOSIS — Z881 Allergy status to other antibiotic agents status: Secondary | ICD-10-CM | POA: Diagnosis not present

## 2020-03-28 DIAGNOSIS — D563 Thalassemia minor: Secondary | ICD-10-CM | POA: Diagnosis not present

## 2020-03-28 MED ORDER — DULOXETINE HCL 20 MG PO CPEP: 40.0000 mg | ORAL_CAPSULE | Freq: Every day | ORAL | 0 refills | Status: DC
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
## 2020-03-28 NOTE — Progress Notes (Signed)
Virtual Visit via Video Note  I connected with Cheryl Burgess on 03/28/20 at  9:00 AM EDT by a video enabled telemedicine application and verified that I am speaking with the correct person using two identifiers.  At orientation to the IOP program Case Manager discussed the limitations of evaluation and management by telemedicine and the availability of in person appointments. The patient expressed understanding and agreed to proceed with virtual visits.   Location:  Patient: Patient Home Provider: Home Office   History of Present Illness: MDD, severe   Observations/Objective: Check In: Case Manager checked in with all participants to review discharge dates, insurance authorizations, work-related documents and needs for the treatment team, including medication review and assessment. Case Manager introduced Cheryl Burgess as new Client to the group, all welcomed and started the joining process.   Initial Therapeutic Activity: Counselor facilitated therapeutic processing with group members to assess mood and current functioning, prompting group members to share about application of skills, progress and challenges in treatment/personal lives. Client joined the group today, sharing what led to need for treatment, goals of treatment, about her support system and current life stressors. Client presents with severe depression and severe anxiety. Client denied any current SI/HI/psychosis.  Second Therapeutic Activity: Counselor engaged group in a discussion about "Social Awkwardness", as requested earlier in the week. Group members shared their struggles related to social anxiety, being introverted, interacting in public during and post-pandemic, and unhealthy cognitions. Client shared that she is introverted and has difficulties with holding conversations. She engaged well in discussion. Counselor provided psychoeducation with an article from [email or web address] and a video from [email or web address], to share skills and  strategies to overcome social awkwardness.   Check Out: Counselor closed program prompting group members to share one self-care practice or productivity activity they plan to engage in today. Client plans to regain access to her electronic device to reduce anxiety.  Assessment and Plan: Clinician recommends that Client remain in IOP treatment to better manage mental health symptoms, stabilization and to address treatment plan goals. Clinician recommends adherence to crisis/safety plan, taking medications as prescribed, and following up with medical professionals if any issues arise.   Follow Up Instructions: Clinician will send Webex link for next session. The Client was advised to call back or seek an in-person evaluation if the symptoms worsen or if the condition fails to improve as anticipated.     I provided 180 minutes of non-face-to-face time during this encounter.     Lise Auer, LCSW

## 2020-03-29 ENCOUNTER — Other Ambulatory Visit: Payer: Self-pay

## 2020-03-29 ENCOUNTER — Other Ambulatory Visit (HOSPITAL_COMMUNITY): Payer: Medicare Other | Admitting: Psychiatry

## 2020-03-29 ENCOUNTER — Encounter (HOSPITAL_COMMUNITY): Payer: Self-pay

## 2020-03-29 DIAGNOSIS — F332 Major depressive disorder, recurrent severe without psychotic features: Secondary | ICD-10-CM

## 2020-03-29 NOTE — Progress Notes (Signed)
Virtual Visit via Video Note  I connected with Kerry Kass on 03/29/20 at  9:00 AM EDT by a video enabled telemedicine application and verified that I am speaking with the correct person using two identifiers.  At orientation to the IOP program Case Manager discussed the limitations of evaluation and management by telemedicine and the availability of in person appointments. The patient expressed understanding and agreed to proceed with virtual visits.   Location:  Patient: Patient Home Provider: Home Office   History of Present Illness: MDD, severe    Observations/Objective: Check In: Case Manager checked in with all participants to review discharge dates, insurance authorizations, work-related documents and needs for the treatment team, including medication review and assessment. Case Manager introduced new Client to the group, all welcomed and started the joining process. Counselor facilitated therapeutic processing with group members to assess mood and current functioning, prompting group members to share about application of skills, progress and challenges in treatment/personal lives. Client reports feeling tired and somewhat disoriented, noting that the "reality of everything is hitting", in regards to her home-life situation. Client presents with moderate depression and severe anxiety. Client denied any current SI/HI/psychosis.    Initial Therapeutic Activity: Counselor introduced Rolin Barry, [place] Chaplain to present information and discussion on Grief and Loss. Group members engaged in discussion, sharing how grief impacts them, what comforts them, what emotions are felt, labeling losses, etc. After guest speaker logged off, Counselor prompted group to spend 10-15 minutes journaling to process personal grief and loss situations. Counselor processed entries with group and client's identified areas for additional processing in individual therapy. Client noted recent loss of finances,  independence, trust, and relationships.  Second Therapeutic Activity: Counselor introduced Field seismologist, Genoveva Ill from [place], to share about their programs and services. Group members engaged in discussion, asked questions and named which programs they are most interested. Client chose Peer Support.  Final Therapeutic Activity: Counselor first engaged the group in an ice breaker for joining, by prompting them to name the animal they most identify with and why. Each group member shared, with client choosing a leopard. Counselor introduced the concept of a Crisis/Safety Plan with the group, facilitating the creation of their own individual plans. Group unable to complete activity and will follow up tomorrow for processing and application. Client noted racing thoughts, ruminations, extreme anxiety attacks, 24/7 monitoring, taking a hot bath, and doing tasks to distract self.  Check Out: Counselor closed program prompting group members to share one self-care practice or productivity activity they plan to engage in today. Client chose to follow up with providers about medications and pick them up from pharmacy today.  Assessment and Plan: Clinician recommends that Client remain in IOP treatment to better manage mental health symptoms, stabilization and to address treatment plan goals. Clinician recommends adherence to crisis/safety plan, taking medications as prescribed, and following up with medical professionals if any issues arise.   Follow Up Instructions: Clinician will send Webex link for next session. The Client was advised to call back or seek an in-person evaluation if the symptoms worsen or if the condition fails to improve as anticipated.     I provided 180 minutes of non-face-to-face time during this encounter.     Lise Auer, LCSW

## 2020-03-30 ENCOUNTER — Other Ambulatory Visit: Payer: Self-pay

## 2020-03-30 ENCOUNTER — Other Ambulatory Visit (HOSPITAL_COMMUNITY): Payer: Medicare Other | Admitting: Psychiatry

## 2020-03-30 ENCOUNTER — Encounter (HOSPITAL_COMMUNITY): Payer: Self-pay

## 2020-03-30 DIAGNOSIS — F332 Major depressive disorder, recurrent severe without psychotic features: Secondary | ICD-10-CM

## 2020-03-30 NOTE — Progress Notes (Signed)
Virtual Visit via Video Note  I connected with Cheryl Burgess on 03/30/20 at  9:00 AM EDT by a video enabled telemedicine application and verified that I am speaking with the correct person using two identifiers.  At orientation to the IOP program Case Manager discussed the limitations of evaluation and management by telemedicine and the availability of in person appointments. The patient expressed understanding and agreed to proceed with virtual visits.   Location:  Patient: Patient Home Provider: Home Office   History of Present Illness: MDD, severe   Observations/Objective: Check In: Case Manager checked in with all participants to review discharge dates, insurance authorizations, work-related documents and needs for the treatment team, including medication review and assessment.    Initial Therapeutic Activity: Counselor facilitated therapeutic processing with group members to assess mood and current functioning, prompting group members to share about application of skills, progress and challenges in treatment/personal lives. Client reports that she is enjoying group and in a "good" mood today. Client has began medications today and will work to taper off 35 year long use of depressive medication. Client noted being hopeful and trusting that it will work with improving symptoms. Client presents with moderate depression and moderate anxiety. Client denied any current SI/HI/psychosis.   Second Therapeutic Activity: Counselor reviewed Steps 1-4 of the Crisis/Safety Plan with the group. Counselor shared emergency crisis number to add to the form and about access to those services. Group members proceeded to complete the document, including what makes their environment safe, motivations for living and who they plan to share their Crisis Plans with in the coming days and weeks. Client shared that her children and grandchildren are her motivators for living.  Check Out: Counselor closed program  prompting group members to share one self-care practice or productivity activity they plan to engage in today. Client plans to go to hear a local band play with her husband and some friends.  Assessment and Plan: Clinician recommends that Client remain in IOP treatment to better manage mental health symptoms, stabilization and to address treatment plan goals. Clinician recommends adherence to crisis/safety plan, taking medications as prescribed, and following up with medical professionals if any issues arise.   Follow Up Instructions: Clinician will send Webex link for next session. The Client was advised to call back or seek an in-person evaluation if the symptoms worsen or if the condition fails to improve as anticipated.     I provided 180 minutes of non-face-to-face time during this encounter.     Lise Auer, LCSW

## 2020-04-02 ENCOUNTER — Other Ambulatory Visit (HOSPITAL_COMMUNITY): Payer: Medicare Other | Admitting: Psychiatry

## 2020-04-02 ENCOUNTER — Encounter (HOSPITAL_COMMUNITY): Payer: Self-pay

## 2020-04-02 ENCOUNTER — Other Ambulatory Visit: Payer: Self-pay

## 2020-04-02 DIAGNOSIS — F332 Major depressive disorder, recurrent severe without psychotic features: Secondary | ICD-10-CM

## 2020-04-02 NOTE — Progress Notes (Signed)
Virtual Visit via Video Note  I connected with Kerry Kass on 04/02/20 at  9:00 AM EDT by a video enabled telemedicine application and verified that I am speaking with the correct person using two identifiers.  At orientation to the IOP program Case Manager discussed the limitations of evaluation and management by telemedicine and the availability of in person appointments. The patient expressed understanding and agreed to proceed with virtual visits.   Location:  Patient: Patient Home Provider: Home Office   History of Present Illness: MDD   Observations/Objective: Check In: Case Manager checked in with all participants to review discharge dates, insurance authorizations, work-related documents and needs for the treatment team, including medication review and assessment. Counselor facilitated therapeutic processing with group members to assess mood and current functioning, prompting group members to share about application of skills, progress and challenges in treatment/personal lives. Client reports having improved mood over the weekend, getting to attend an outing with friends, which she enjoyed. Client noted that the practice of journaling has helped to reduce anxiety and in communication of feelings/needs with support system. Client presents with moderate depression and moderate anxiety. Client denied any current SI/HI/psychosis.   Initial Therapeutic Activity: Counselor facilitated a discussion to identify current needs of the group and feedback on what is working and not working related to group processes. Group members identified that they would like information on the following topics: practical coping skills to combat depression/anxiety, practicing the skills together in session, social skill development, identity work/imposter syndrome, future planning for long-term maintenance of mental health/engagement in treatments. Group members also would like journal prompts and time set aside  during group to reflect. One group member would like more homework tasks to do for application of skills. Counselor to implement ideas and feedback.  Second Therapeutic Activity: Counselor engaged group in learning/practicing 8-10 Deep Breathing Techniques. Group members participated and engaged in the prompts. Group members gave feedback about which ones they found most relaxing or useful. Client identified belly breathing as being helpful.   Check Out: Counselor closed program prompting group members to share one self-care practice or productivity activity they plan to engage in today. Client plans to take a nap in the sun in a room at her home, as she is feeling relaxed and drowsy.  Assessment and Plan: Clinician recommends that Client remain in IOP treatment to better manage mental health symptoms, stabilization and to address treatment plan goals. Clinician recommends adherence to crisis/safety plan, taking medications as prescribed, and following up with medical professionals if any issues arise.   Follow Up Instructions: Clinician will send Webex link for next session. The Client was advised to call back or seek an in-person evaluation if the symptoms worsen or if the condition fails to improve as anticipated.     I provided 180 minutes of non-face-to-face time during this encounter.     Lise Auer, LCSW

## 2020-04-03 ENCOUNTER — Other Ambulatory Visit: Payer: Self-pay

## 2020-04-03 ENCOUNTER — Other Ambulatory Visit (HOSPITAL_COMMUNITY): Payer: Medicare Other | Admitting: Psychiatry

## 2020-04-03 ENCOUNTER — Encounter (HOSPITAL_COMMUNITY): Payer: Self-pay

## 2020-04-03 DIAGNOSIS — F332 Major depressive disorder, recurrent severe without psychotic features: Secondary | ICD-10-CM

## 2020-04-03 NOTE — Progress Notes (Signed)
Virtual Visit via Video Note  I connected with Cheryl Burgess on 04/03/20 at  9:00 AM EDT by a video enabled telemedicine application and verified that I am speaking with the correct person using two identifiers.  At orientation to the IOP program Case Manager discussed the limitations of evaluation and management by telemedicine and the availability of in person appointments. The patient expressed understanding and agreed to proceed with virtual visits.   Location:  Patient: Patient Home Provider: Home Office   History of Present Illness: MDD   Observations/Objective: Check In: Case Manager checked in with all participants to review discharge dates, insurance authorizations, work-related documents and needs for the treatment team, including medication review and assessment. Case Manager introduced 2 new clients to the program, encouraging others to welcome and start the joining process. Counselor facilitated therapeutic processing with group members to assess mood and current functioning, prompting group members to share about application of skills, progress and challenges in treatment/personal lives. Client reports improvement in mood and ability to relax, noting that applications of coping skills. Client presents with moderate depression and moderate anxiety. Client denied any current SI/HI/psychosis.   Initial Therapeutic Activity: Counselor prompted group to reflect and journal on the following question: What unrealistic expectations do you have in your life? 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 Counselor allowed time to journal, then for open discussion amongst the group about their reflections. Counselor and group members shared strategies in shifting our expectations to be more realistic and to accept the things we cannot change/influence.  Second Therapeutic Activity: Counselor provided pyshcoeducation on the benefits of self-soothing techniques. Counselor  described and prompted clients to practice 8 self-soothing skills. Group members participated and engaged in the activities, giving feedback on immediate benefits and application in "real life" settings. Counselor provided the clients a link for homework from [employer], to complete a self-assessment on their current self-care practices.   Check Out: Counselor closed program prompting group members to share one self-care practice or productivity activity they plan to engage in today. Client plans to eat lunch with a friend who she is hoping to repair their relationship. Client anticipates meeting with be emotional and healing.   Assessment and Plan: Clinician recommends that Client remain in IOP treatment to better manage mental health symptoms, stabilization and to address treatment plan goals. Clinician recommends adherence to crisis/safety plan, taking medications as prescribed, and following up with medical professionals if any issues arise.   Follow Up Instructions: Clinician will send Webex link for next session. The Client was advised to call back or seek an in-person evaluation if the symptoms worsen or if the condition fails to improve as anticipated.     I provided 180 minutes of non-face-to-face time during this encounter.     Lise Auer, LCSW

## 2020-04-04 ENCOUNTER — Encounter (HOSPITAL_COMMUNITY): Payer: Self-pay

## 2020-04-04 ENCOUNTER — Other Ambulatory Visit: Payer: Self-pay

## 2020-04-04 ENCOUNTER — Other Ambulatory Visit (HOSPITAL_COMMUNITY): Payer: Medicare Other | Admitting: Psychiatry

## 2020-04-04 DIAGNOSIS — F332 Major depressive disorder, recurrent severe without psychotic features: Secondary | ICD-10-CM

## 2020-04-04 NOTE — Progress Notes (Signed)
Virtual Visit via Video Note  I connected with Kerry Kass on 04/04/20 at  9:00 AM EDT by a video enabled telemedicine application and verified that I am speaking with the correct person using two identifiers.  At orientation to the IOP program Case Manager discussed the limitations of evaluation and management by telemedicine and the availability of in person appointments. The patient expressed understanding and agreed to proceed with virtual visits.   Location:  Patient: Patient Home Provider: Home Office   History of Present Illness: MDD   Observations/Objective: Check In: Case Manager checked in with all participants to review discharge dates, insurance authorizations, work-related documents and needs for the treatment team, including medication review and assessment.    Initial Therapeutic Activity: Counselor introduced our guest speaker, Einar Grad, [facility] Pharmacist, who shared about psychiatric medications, side effects, treatment considerations and how to communicate with medical professionals. Group Members asked questions and shared medication concerns. Counselor prompted group members to reference a worksheet called, "Body Scan" to jot down questions and concerns about their physical health in preparation for their upcoming appointments with medical professionals. Client asked specific questions about medication reactions. Client noted that she is anticipating an upcoming surgery, has GI issues and skin issues that she sees specialists for treatment. Counselor encouraged routine medical check-ups, preparing for appointments, following up with recommendations and seeking specialist, if needed.   Second Therapeutic Activity: Counselor facilitated therapeutic processing with group members to assess mood and current functioning, prompting group members to share about application of skills, progress and challenges in treatment/personal lives. Client reports that she is in a "good" mood,  drowsy, lingering cold and had a nice meal with her friend yesterday. Client presents with moderate depression and moderate anxiety. Client denied any current SI/HI/psychosis. Counselor provided Group with a Self-Awareness Assessment to work on for homework in preparation for our session tomorrow.  Check Out: Counselor closed program prompting group members to share one self-care practice or productivity activity they plan to engage in today. Client plans to go to the doctor to address a minor chronic medical issue to gain relief.  Assessment and Plan: Clinician recommends that Client remain in IOP treatment to better manage mental health symptoms, stabilization and to address treatment plan goals. Clinician recommends adherence to crisis/safety plan, taking medications as prescribed, and following up with medical professionals if any issues arise.   Follow Up Instructions: Clinician will send Webex link for next session. The Client was advised to call back or seek an in-person evaluation if the symptoms worsen or if the condition fails to improve as anticipated.     I provided 180 minutes of non-face-to-face time during this encounter.     Lise Auer, LCSW

## 2020-04-05 ENCOUNTER — Encounter (HOSPITAL_COMMUNITY): Payer: Self-pay

## 2020-04-05 ENCOUNTER — Other Ambulatory Visit: Payer: Self-pay

## 2020-04-05 ENCOUNTER — Other Ambulatory Visit (HOSPITAL_COMMUNITY): Payer: Medicare Other | Attending: Psychiatry | Admitting: Psychiatry

## 2020-04-05 DIAGNOSIS — R45851 Suicidal ideations: Secondary | ICD-10-CM | POA: Diagnosis not present

## 2020-04-05 DIAGNOSIS — F419 Anxiety disorder, unspecified: Secondary | ICD-10-CM | POA: Insufficient documentation

## 2020-04-05 DIAGNOSIS — N9489 Other specified conditions associated with female genital organs and menstrual cycle: Secondary | ICD-10-CM | POA: Diagnosis not present

## 2020-04-05 DIAGNOSIS — Z915 Personal history of self-harm: Secondary | ICD-10-CM | POA: Insufficient documentation

## 2020-04-05 DIAGNOSIS — G4700 Insomnia, unspecified: Secondary | ICD-10-CM | POA: Diagnosis not present

## 2020-04-05 DIAGNOSIS — F332 Major depressive disorder, recurrent severe without psychotic features: Secondary | ICD-10-CM | POA: Diagnosis present

## 2020-04-05 NOTE — Progress Notes (Signed)
Virtual Visit via Video Note  I connected with Cheryl Burgess on 04/05/20 at  9:00 AM EDT by a video enabled telemedicine application and verified that I am speaking with the correct person using two identifiers.  At orientation to the IOP program Case Manager discussed the limitations of evaluation and management by telemedicine and the availability of in person appointments. The patient expressed understanding and agreed to proceed with virtual visits.   Location:  Patient: Patient Home Provider: Home Office   History of Present Illness: MDD   Observations/Objective: Check In: Case Manager checked in with all participants to review discharge dates, insurance authorizations, work-related documents and needs for the treatment team, including medication review and assessment. Counselor facilitated therapeutic processing with group members to assess mood and current functioning, prompting group members to share about application of skills, progress and challenges in treatment/personal lives. Client reports noted a strain in her marital relationship. Counselor and group members offered communication strategies, encouragement, validation and resources to address. Client was receptive. Client noted that lunch with friend went well and that she is feeling more supported and understood. Client presents with moderate depression and moderate anxiety. Client denied any current SI/HI/psychosis.    Initial Therapeutic Activity: Counselor prompted group members to reflect on and journal about their personal mission statement. Counselor gave the group examples and how to guide in writing the statements. Group members shared their mission statements aloud and discussed meaning behind the statements. Client's statement, "Be Kind, See Good, Strive to Be Best I Can Be." Client noted that this activity was difficult because she has been preoccupied and safety/anxiety for so long, it was hard to look to future and  purpose. Starting to be more able to let guard down.  Second Therapeutic Activity: Counselor reviewed the [employer] with clients that was given for homework. Clients shared their progress and areas of struggle. Counselor provided psychoeducation, benefits and strategies for doing self-assessments and self-reflection. Group members identified a need to spend more time in relearning and rediscovering themselves, during the vary stages of life they are in. Client noted that she does not know herself, being sick for 2 years with mental health condition has made it challenging to know and love self. She would like to work on this more at a different time.  Check Out: Counselor closed program prompting group members to share one self-care practice or productivity activity they plan to engage in today. Client plans to "run errands".  Assessment and Plan: Clinician recommends that Client remain in IOP treatment to better manage mental health symptoms, stabilization and to address treatment plan goals. Clinician recommends adherence to crisis/safety plan, taking medications as prescribed, and following up with medical professionals if any issues arise.   Follow Up Instructions: Clinician will send Webex link for next session. The Client was advised to call back or seek an in-person evaluation if the symptoms worsen or if the condition fails to improve as anticipated.     I provided 180 minutes of non-face-to-face time during this encounter.     Lise Auer, LCSW

## 2020-04-06 ENCOUNTER — Other Ambulatory Visit (HOSPITAL_COMMUNITY): Payer: Medicare Other | Admitting: Psychiatry

## 2020-04-06 ENCOUNTER — Other Ambulatory Visit: Payer: Self-pay

## 2020-04-06 ENCOUNTER — Encounter (HOSPITAL_COMMUNITY): Payer: Self-pay

## 2020-04-06 DIAGNOSIS — F332 Major depressive disorder, recurrent severe without psychotic features: Secondary | ICD-10-CM | POA: Diagnosis not present

## 2020-04-06 NOTE — Progress Notes (Signed)
Virtual Visit via Video Note  I connected with Kerry Kass on 04/06/20 at  9:00 AM EDT by a video enabled telemedicine application and verified that I am speaking with the correct person using two identifiers.  At orientation to the IOP program Case Manager discussed the limitations of evaluation and management by telemedicine and the availability of in person appointments. The patient expressed understanding and agreed to proceed with virtual visits.   Location:  Patient: Patient Home Provider: Home Office   History of Present Illness: MDD   Observations/Objective: Check In: Case Manager checked in with all participants to review discharge dates, insurance authorizations, work-related documents and needs for the treatment team, including medication review and assessment. Counselor facilitated therapeutic processing with group members to assess mood and current functioning, prompting group members to share about application of skills, progress and challenges in treatment/personal lives. Client reports that she is doing "fine" today. In exploring her mood deeper, she shared that she feels alone, guilty and ashamed of her current circumstances. She was able to identify that she is not actually alone and that many people still love her and show their support. Client presents with moderate depression and moderate anxiety. Client denied any current SI/HI/psychosis.    Initial Therapeutic Activity: Counselor prompted group members to reflect on and journal about the people who they look up to or are inspired by. Group Members each shared their response, which sparked conversation about who is looking up to them, as well as how they view and share about their mental health in context. Client identified her parents who have since passed, as their inspiration, for their integrity and care.  Second Therapeutic Activity: Counselor engaged the group in a guided imagery, which incorporated deep breathing,  positive affirmations, muscle relaxation and visualizations. The guided imagery was titled Relaxation for Positive Self-Image. Client reported feeling sleepy, relaxed and uplifted.   Check Out: Counselor closed program prompting group members to share one self-care practice or productivity activity they plan to engage in today. Client plans to attend a family cookout with confidence and apply strategies discussed in group to combat social anxiety. Client confirmed adherence to safety plan, no safety issues to report.  Assessment and Plan: Clinician recommends that Client remain in IOP treatment to better manage mental health symptoms, stabilization and to address treatment plan goals. Clinician recommends adherence to crisis/safety plan, taking medications as prescribed, and following up with medical professionals if any issues arise.   Follow Up Instructions: Clinician will send Webex link for next session. The Client was advised to call back or seek an in-person evaluation if the symptoms worsen or if the condition fails to improve as anticipated.     I provided 180 minutes of non-face-to-face time during this encounter.     Lise Auer, LCSW

## 2020-04-10 ENCOUNTER — Other Ambulatory Visit (HOSPITAL_COMMUNITY): Payer: Medicare Other | Admitting: Psychiatry

## 2020-04-10 ENCOUNTER — Encounter (HOSPITAL_COMMUNITY): Payer: Self-pay

## 2020-04-10 ENCOUNTER — Other Ambulatory Visit: Payer: Self-pay

## 2020-04-10 DIAGNOSIS — F332 Major depressive disorder, recurrent severe without psychotic features: Secondary | ICD-10-CM

## 2020-04-10 NOTE — Patient Instructions (Signed)
vi 

## 2020-04-10 NOTE — Progress Notes (Signed)
Virtual Visit via Video Note  I connected with Kerry Kass on 04/10/20 at  9:00 AM EDT by a video enabled telemedicine application and verified that I am speaking with the correct person using two identifiers.  At orientation to the IOP program Case Manager discussed the limitations of evaluation and management by telemedicine and the availability of in person appointments. The patient expressed understanding and agreed to proceed with virtual visits.   Location:  Patient: Patient Home Provider: Home Office   History of Present Illness: MDD   Observations/Objective: Check In: Case Manager checked in with all participants to review discharge dates, insurance authorizations, work-related documents and needs for the treatment team, including medication review and assessment. Case Manager introduced 2 new clients, group members welcomed and began the joining process.    Initial Therapeutic Activity: Counselor facilitated therapeutic processing with group members to assess mood and current functioning, prompting group members to share about application of skills, progress and challenges in treatment/personal lives. Client reports that she was awake "all weekend" only sleeping one or two hours per night. Client is concerned that it is related to new medications. Client to discuss with NP to adjustments. Client stated that she was more agitated, short in communications, journaling to explore and express feelings in a healthier way. Client presents with moderate depression and severe anxiety. Client denied any current SI/HI/psychosis.  Second Therapeutic Activity: Counselor facilitated a Chief Operating Officer, prompting group members to share are various resources they have used that are helpful in managing their mental health symptoms. Counselor collected resources provided by the group and added them to an exhaustive list of global, international, state, and local resources they can access as they continue  with management of mental health. Counselor requested each group member to look over list and report back on 2-3 resources they plan to access now or in the future.   Check Out: Counselor closed program prompting group members to share one self-care practice or productivity activity they plan to engage in today. Client plans to try to rest her body, even if she is unable to sleep. Client confirmed adherence to safety plan, no safety issues to report.  Assessment and Plan: Clinician recommends that Client remain in IOP treatment to better manage mental health symptoms, stabilization and to address treatment plan goals. Clinician recommends adherence to crisis/safety plan, taking medications as prescribed, and following up with medical professionals if any issues arise.   Follow Up Instructions: Clinician will send Webex link for next session. The Client was advised to call back or seek an in-person evaluation if the symptoms worsen or if the condition fails to improve as anticipated.     I provided 180 minutes of non-face-to-face time during this encounter.     Lise Auer, LCSW

## 2020-04-11 ENCOUNTER — Encounter (HOSPITAL_COMMUNITY): Payer: Self-pay

## 2020-04-11 ENCOUNTER — Other Ambulatory Visit: Payer: Self-pay

## 2020-04-11 ENCOUNTER — Other Ambulatory Visit (HOSPITAL_COMMUNITY): Payer: Medicare Other | Admitting: Psychiatry

## 2020-04-11 DIAGNOSIS — F332 Major depressive disorder, recurrent severe without psychotic features: Secondary | ICD-10-CM

## 2020-04-11 MED ORDER — ESCITALOPRAM OXALATE 5 MG PO TABS: 5.0000 mg | ORAL_TABLET | Freq: Every day | ORAL | Status: DC

## 2020-04-11 MED ORDER — ESCITALOPRAM OXALATE 5 MG PO TABS: 5.0000 mg | ORAL_TABLET | Freq: Every day | ORAL | 0 refills | Status: DC

## 2020-04-11 NOTE — Progress Notes (Signed)
Patient reports increased in insomnia and sexual dysfunction related to Cymbalta. Will start Lexapro 5mg  po qhs for depression. Patient is continuing to slowly titrate off of imipramine. At this time she is taking Imipramine 50mg  po qod, and is encouraged to continue through this Saturday. She is to begin taking Imipramine on Tuesday and continue taking three times a week. Again we reviewed the importance of not abruptly discontinuation of this medication to prevent withdraw symptoms and worsening depression and suicidal ideations. Treatment options and alternatives reviewed with patient and patient understands the plan above. We will continue her IOP through 04/18/2020, to allow for appropriate medication management.  Treatment plan was reviewed and patient agreed upon by nurse practitioner Sheran Fava and patient Cheryl Burgess need for group services.

## 2020-04-11 NOTE — Progress Notes (Signed)
Virtual Visit via Video Note  I connected with Kerry Kass on 04/11/20 at  9:00 AM EDT by a video enabled telemedicine application and verified that I am speaking with the correct person using two identifiers.  At orientation to the IOP program Case Manager discussed the limitations of evaluation and management by telemedicine and the availability of in person appointments. The patient expressed understanding and agreed to proceed with virtual visits.   Location:  Patient: Patient Home Provider: Home Office   History of Present Illness: MDD   Observations/Objective: Check In: Case Manager checked in with all participants to review discharge dates, insurance authorizations, work-related documents and needs for the treatment team, including medication review and assessment.    Initial Therapeutic Activity/Processing: Counselor facilitated therapeutic processing with group members to assess mood and current functioning, prompting group members to share about application of skills, progress and challenges in treatment/personal lives. Client reports being able to sleep for 11 hours, by adjusting medication administration to earlier in the day. Client noted feeling "good and rested", noting being grateful for the ability to sleep. Client presents with moderate depression and severe anxiety. Client denied any current SI/HI/psychosis.  Second Therapeutic Activity: Counselor provided group with psychoeducation on "Structuring Your Day" to reduce anxiety, combat depression, alleviate mental stress and to regain control of personal lives. Counselor shared information from 3 sources, prompting clients to begin creating a daily and weekly routine utilizing strategies discussed in articles. Counselor allowed time for group members to share other resources/tips within the group. Client stated that she is interested in the [employer], support groups and the anxiety and depression tracker apps.  Check  Out: Counselor closed program prompting group members to share one self-care practice or productivity activity they plan to engage in today. Client plans to run errands and change sheets on bed to help with sleep hygiene. Client confirmed adherence to safety plan, no safety issues to report. Counselor allowed time to celebrate a graduating group member. Counselor shared reflections on progress and allow space for group members to share well wishes and appreciates to the graduating client. Counselor prompted graduating client to share takeaways, reflect on progress and final thoughts for the group.  Assessment and Plan: Clinician recommends that Client remain in IOP treatment to better manage mental health symptoms, stabilization and to address treatment plan goals. Clinician recommends adherence to crisis/safety plan, taking medications as prescribed, and following up with medical professionals if any issues arise.   Follow Up Instructions: Clinician will send Webex link for next session. The Client was advised to call back or seek an in-person evaluation if the symptoms worsen or if the condition fails to improve as anticipated.     I provided 180 minutes of non-face-to-face time during this encounter.     Lise Auer, LCSW

## 2020-04-12 ENCOUNTER — Other Ambulatory Visit: Payer: Self-pay

## 2020-04-12 ENCOUNTER — Other Ambulatory Visit (HOSPITAL_COMMUNITY): Payer: Medicare Other | Admitting: Psychiatry

## 2020-04-12 ENCOUNTER — Encounter (HOSPITAL_COMMUNITY): Payer: Self-pay

## 2020-04-12 DIAGNOSIS — F332 Major depressive disorder, recurrent severe without psychotic features: Secondary | ICD-10-CM

## 2020-04-12 NOTE — Progress Notes (Signed)
Virtual Visit via Video Note  I connected with Cheryl Burgess on 04/12/20 at  9:00 AM EDT by a video enabled telemedicine application and verified that I am speaking with the correct person using two identifiers.  At orientation to the IOP program Case Manager discussed the limitations of evaluation and management by telemedicine and the availability of in person appointments. The patient expressed understanding and agreed to proceed with virtual visits.   Location:  Patient: Patient Home Provider: Home Office   History of Present Illness: MDD   Observations/Objective: Check In: Case Manager checked in with all participants to review discharge dates, insurance authorizations, work-related documents and needs for the treatment team, including medication review and assessment. Counselor facilitated therapeutic processing with group members to assess mood and current functioning, prompting group members to share about application of skills, progress and challenges in treatment/personal lives. Client reports that her medications are helping with sleep, however they are causing "odd" side effects that she would like to discuss with NP in private. Client states that she is working on applying coping skills to better manage anxiety at night. Client presents with mild depression and moderate anxiety. Client denied any current SI/HI/psychosis.   Initial Therapeutic Activity/Processing: Counselor introduced Karsten Ro, [facility] to present information and discussion on Grief and Loss. Group members engaged in discussion, sharing how grief impacts them, what comforts them, what emotions are felt, labeling losses, etc. After guest speaker logged off, Counselor prompted group to spend 10-15 minutes journaling to process personal grief and loss situations. Counselor processed entries with group and client's identified areas for additional processing in individual therapy. Client engaged by listening and  journaling, was not ready to share out loud today on this topic.  Second Therapeutic Activity: To alleviate grief response, after break Group Members were prompted to share about their favorite movie or tv shows that serve as an escape or for entertainment. Client shared that she enjoys "trash tv"- meaning reality TV shows. Counselor provided group with two exhaustive lists of coping skills that were broken down into categories of relevance. Counselor prompted Group Members to choose 3 each that they would be willing to try or implement in prevention or crisis divergence to reduce mental health symptoms and responses. Client stated that she could apply drinking a hot tea for mindfulness practice and playing a sudoko to distract her mind differently.   Check Out: Counselor allowed time to celebrate 2 graduating group members. Counselor shared reflections on progress and allow space for group members to share well wishes and appreciates to the graduating client. Counselor prompted graduating client to share takeaways, reflect on progress and final thoughts for the group. Client confirmed adherence to safety plan, no safety issues to report  Assessment and Plan: Clinician recommends that Client remain in IOP treatment to better manage mental health symptoms, stabilization and to address treatment plan goals. Clinician recommends adherence to crisis/safety plan, taking medications as prescribed, and following up with medical professionals if any issues arise.   Follow Up Instructions: Clinician will send Webex link for next session. The Client was advised to call back or seek an in-person evaluation if the symptoms worsen or if the condition fails to improve as anticipated.     I provided 180 minutes of non-face-to-face time during this encounter.     Lise Auer, LCSW

## 2020-04-13 ENCOUNTER — Other Ambulatory Visit: Payer: Self-pay

## 2020-04-13 ENCOUNTER — Encounter (HOSPITAL_COMMUNITY): Payer: Self-pay

## 2020-04-13 ENCOUNTER — Other Ambulatory Visit (HOSPITAL_COMMUNITY): Payer: Medicare Other | Admitting: Psychiatry

## 2020-04-13 DIAGNOSIS — F332 Major depressive disorder, recurrent severe without psychotic features: Secondary | ICD-10-CM

## 2020-04-13 NOTE — Progress Notes (Signed)
Virtual Visit via Video Note  I connected with Kerry Kass on 04/13/20 at  9:00 AM EDT by a video enabled telemedicine application and verified that I am speaking with the correct person using two identifiers.  At orientation to the IOP program Case Manager discussed the limitations of evaluation and management by telemedicine and the availability of in person appointments. The patient expressed understanding and agreed to proceed with virtual visits throughout the duration of the program.   Location:  Patient: Patient Home Provider: Home Office   History of Present Illness: MDD   Observations/Objective: Check In: Case Manager checked in with all participants to review discharge dates, insurance authorizations, work-related documents and needs of the treatment team, including medication review and assessment. Counselor facilitated therapeutic processing with group members to assess mood and current functioning, prompting group members to share about application of skills, progress and challenges in treatment/personal lives. Client reports improvement in relationship with her husband. Client processed how to share about her mental health episode with her adult daughter.Client noted the ability to fall asleep, but is agitated and dreaming throughout the night, waking at 3:40am without being able to return to sleep. Client presents with moderate depression and moderate anxiety. Client denied any current SI/HI/psychosis.   Initial Therapeutic Activity: Counselor introduced Genoveva Ill, representative with [place] to share about programming. Group Members asked questions and engaged in discussion, as Cristie Hem shared about Peer Support, Support Groups and the [employer]. Client stated that they are interested in connecting with the [facility].  Second Therapeutic Activity: Counselor provided psychoeducation through 2 videos created by a doctor and a psychologist in  reducing work-related stress on the job and at home. Counselor prompted group members to share takeaways, with Client reporting that she is not currently working, but that they were good general reminders for overall well being. Client provided 3 additional articles for group to review for homework related to the same topic.  Check Out: Counselor closed program by allowing time to celebrate a graduating group member. Counselor shared reflections on progress and allow space for group members to share well wishes and appreciates to the graduating client. Counselor prompted graduating client to share takeaways, reflect on progress and final thoughts for the group. Client confirmed adherence to safety plan, no safety issues to report.  Assessment and Plan: Clinician recommends that Client remain in IOP treatment to better manage mental health symptoms, stabilization and to address treatment plan goals. Clinician recommends adherence to crisis/safety plan, taking medications as prescribed, and following up with medical professionals if any issues arise.   Follow Up Instructions: Clinician will send Webex link for next session. The Client was advised to call back or seek an in-person evaluation if the symptoms worsen or if the condition fails to improve as anticipated.     I provided 180 minutes of non-face-to-face time during this encounter.     Lise Auer, LCSW

## 2020-04-16 ENCOUNTER — Encounter (HOSPITAL_COMMUNITY): Payer: Self-pay

## 2020-04-16 ENCOUNTER — Other Ambulatory Visit (HOSPITAL_COMMUNITY): Payer: Medicare Other | Admitting: Psychiatry

## 2020-04-16 ENCOUNTER — Other Ambulatory Visit: Payer: Self-pay

## 2020-04-16 DIAGNOSIS — F332 Major depressive disorder, recurrent severe without psychotic features: Secondary | ICD-10-CM | POA: Diagnosis not present

## 2020-04-16 NOTE — Progress Notes (Signed)
Virtual Visit via Video Note  I connected with Cheryl Burgess on 04/16/20 at  9:00 AM EDT by a video enabled telemedicine application and verified that I am speaking with the correct person using two identifiers.  At orientation to the IOP program Case Manager discussed the limitations of evaluation and management by telemedicine and the availability of in person appointments. The patient expressed understanding and agreed to proceed with virtual visits throughout the duration of the program.   Location:  Patient: Patient Home Provider: Home Office   History of Present Illness: MDD   Observations/Objective: Check In: Case Manager checked in with all participants to review discharge dates, insurance authorizations, work-related documents and needs of the treatment team, including medication review and assessment. Case Manager welcomed a new client, with group members welcoming and starting the joining process. Counselor facilitated therapeutic processing with group members to assess mood and current functioning, prompting group members to share about application of skills, progress and challenges in treatment/personal lives. Client reports increased arguments over the weekend with spouse. Client noted that spouse is not interested in participating in therapy, sharing historical context of relationship and how they view treatment. Client discussed need for resources to better inform self about how to manage her situation. Counselor sent resources at the end of session. Client notes medications are not strong enough to help with sleep.  Client presents with moderate depression and severe anxiety. Client denied any current SI/HI/psychosis.   Initial Therapeutic Activity: Counselor prompted group members to create a vin diagram, one circle representing anxiety symptoms and the other representing depressive symptoms, with the overlapping section, those in common for both anxiety and depression. Group  Members shared their reflections and self-assessments. Counselor shared psychoeducation on use of this activity in self-awareness, communicating needs, and labeling feelings. Client noted Depression: no interest, don't want to leave the house, want to be alone, but gets lonely, crying beating self-up for choices, retial therapy, anger, can't sleep, fear of future, mind on overdrive, helpless and hopeless.   Second Therapeutic Activity: Counselor shared a video by Anne Fu, LMFT Therapist in a Nutshell, about how avoidance is what overlaps depression and anxiety. Discussing the concepts of learning to feel better vs feeling better, willingness, numbing and loss of capacity to feel. Counselor received feedback from group members on their [place] and how to apply concepts. Client noted that she wants to keep avoiding because it feels safer. Client to process more in individual therapy.   Check Out: Counselor reviewed upcoming sessions for the week, took pending questions and addressed needs through sending various links to clients after the session ended, as requested.   Assessment and Plan: Clinician recommends that Client remain in IOP treatment to better manage mental health symptoms, stabilization and to address treatment plan goals. Clinician recommends adherence to crisis/safety plan, taking medications as prescribed, and following up with medical professionals if any issues arise.   Follow Up Instructions: Clinician will send Webex link for next session. The Client was advised to call back or seek an in-person evaluation if the symptoms worsen or if the condition fails to improve as anticipated.     I provided 180 minutes of non-face-to-face time during this encounter.     Cheryl Auer, LCSW

## 2020-04-17 ENCOUNTER — Other Ambulatory Visit: Payer: Self-pay

## 2020-04-17 ENCOUNTER — Encounter (HOSPITAL_COMMUNITY): Payer: Self-pay

## 2020-04-17 ENCOUNTER — Other Ambulatory Visit (HOSPITAL_COMMUNITY): Payer: Medicare Other | Admitting: Psychiatry

## 2020-04-17 DIAGNOSIS — F332 Major depressive disorder, recurrent severe without psychotic features: Secondary | ICD-10-CM
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
## 2020-04-18 ENCOUNTER — Other Ambulatory Visit: Payer: Self-pay

## 2020-04-18 ENCOUNTER — Encounter (HOSPITAL_COMMUNITY): Payer: Self-pay

## 2020-04-18 ENCOUNTER — Other Ambulatory Visit (HOSPITAL_COMMUNITY): Payer: Medicare Other | Admitting: Psychiatry

## 2020-04-18 DIAGNOSIS — F332 Major depressive disorder, recurrent severe without psychotic features: Secondary | ICD-10-CM | POA: Diagnosis not present

## 2020-04-18 MED ORDER — ESCITALOPRAM OXALATE 10 MG PO TABS
10.0000 mg | ORAL_TABLET | Freq: Every day | ORAL | 2 refills | Status: DC
Start: 2020-04-18 — End: 2020-06-06
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
## 2020-04-18 NOTE — Progress Notes (Signed)
Virtual Visit via Video Note  I connected with Kerry Kass on 04/18/20 at  9:00 AM EDT by a video enabled telemedicine application and verified that I am speaking with the correct person using two identifiers.  At orientation to the IOP program Case Manager discussed the limitations of evaluation and management by telemedicine and the availability of in person appointments. The patient expressed understanding and agreed to proceed with virtual visits throughout the duration of the program.   Location:  Patient: Patient Home Provider: Home Office   History of Present Illness: MDD   Observations/Objective: Check In: Case Manager checked in with all participants to review discharge dates, insurance authorizations, work-related documents and needs of the treatment team, including medication review and assessment. Client presents with mild depression and moderate anxiety. Client denied any current SI/HI/psychosis.   Initial Therapeutic Activity: Counselor introduced our guest speaker, Einar Grad, [facility] Pharmacist, who shared about psychiatric medications, side effects, treatment considerations and how to communicate with medical professionals. Group Members asked questions and shared medication concerns. Counselor prompted group members to reference a worksheet called, Body Scan to jot down questions and concerns about their physical health in preparation for their upcoming appointments with medical professionals. Client noted recent onset of headaches and vomiting, and a need for gall bladder removal. Client notes being connected well with providers and anxiety about upcoming therapy and psychiatry appointment.  Counselor encouraged routine medical check-ups, preparing for appointments, following up with recommendations and seeking specialist if needed.   Second Therapeutic Activity: Counselor provided group with psychoeducation on cognitive coping to eliminate cognitive distortions and thought  stopping techniques to combat racing thoughts and ruminations, using 3 articles and providing links at the end for skill building and practice. Client noted that they would like to try auditory distractions, muscle isolation and scattered counting the next time they catch themselves in a cognitive distortion or racing thoughts.   Check Out: Counselor closed program by allowing time to celebrate the Client as a graduating group member. Counselor shared reflections on progress and allowed space for group members to share well wishes and appreciates to the graduating client. Counselor prompted graduating client to share takeaways, reflect on progress and final thoughts for the group. Client noted that she felt heard, understood, accepted and supported by the group. She appreciates the resources and is closer in accepting her situation and how to better cope. Client expressed gratitude towards experience in the program.  Assessment and Plan: Clinician recommends that Client step down to medication management and individual therapy from IOP treatment to manage mental health symptoms and to reassess treatment plan goals. Clinician recommends adherence to crisis/safety plan, taking medications as prescribed, and following up with medical professionals if any issues arise.   Follow Up Instructions: Clinician will send Webex link for next session. The Client was advised to call back or seek an in-person evaluation if the symptoms worsen or if the condition fails to improve as anticipated.     I provided 180 minutes of non-face-to-face time during this encounter.     Lise Auer, LCSW
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
## 2020-04-18 NOTE — Patient Instructions (Signed)
D:  Patient completed MH-IOP today.  A:  Discharge today.  Follow up with Boyd Kerbs, LCSW on 04-23-20 @ 9 a.m. and Dr. Montel Culver on 05-01-20 @ 9 a.m..  Encouraged support groups.  R:  Patient receptive.

## 2020-04-18 NOTE — Progress Notes (Deleted)
Virtual Visit via Video Note  I connected with Cheryl Burgess on 04/18/20 at  9:00 AM EDT by a video enabled telemedicine application and verified that I am speaking with the correct person using two identifiers.  Location: Patient: *** Provider: ***   I discussed the limitations of evaluation and management by telemedicine and the availability of in person appointments. The patient expressed understanding and agreed to proceed.  History of Present Illness:    Observations/Objective:   Assessment and Plan:   Follow Up Instructions:    I discussed the assessment and treatment plan with the patient. The patient was provided an opportunity to ask questions and all were answered. The patient agreed with the plan and demonstrated an understanding of the instructions.   The patient was advised to call back or seek an in-person evaluation if the symptoms worsen or if the condition fails to improve as anticipated.  I provided *** minutes of non-face-to-face time during this encounter.   Lise Auer, LCSW

## 2020-04-19 ENCOUNTER — Other Ambulatory Visit: Payer: Self-pay

## 2020-04-19 ENCOUNTER — Other Ambulatory Visit (HOSPITAL_COMMUNITY): Payer: Medicare Other

## 2020-04-20 ENCOUNTER — Ambulatory Visit (HOSPITAL_COMMUNITY): Payer: Medicare Other

## 2020-04-20 ENCOUNTER — Telehealth (HOSPITAL_COMMUNITY): Payer: Medicare Other | Admitting: Psychiatry

## 2020-04-23 ENCOUNTER — Other Ambulatory Visit: Payer: Self-pay

## 2020-04-23 ENCOUNTER — Ambulatory Visit (INDEPENDENT_AMBULATORY_CARE_PROVIDER_SITE_OTHER): Payer: Medicare Other | Admitting: Licensed Clinical Social Worker

## 2020-04-23 ENCOUNTER — Encounter: Payer: Self-pay | Admitting: Licensed Clinical Social Worker

## 2020-04-23 DIAGNOSIS — F411 Generalized anxiety disorder: Secondary | ICD-10-CM

## 2020-04-23 DIAGNOSIS — F332 Major depressive disorder, recurrent severe without psychotic features: Secondary | ICD-10-CM | POA: Diagnosis not present
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
## 2020-04-24 ENCOUNTER — Ambulatory Visit (HOSPITAL_COMMUNITY): Payer: Medicare Other | Admitting: Psychiatry

## 2020-04-26 ENCOUNTER — Ambulatory Visit (HOSPITAL_COMMUNITY): Payer: Medicare Other | Admitting: Psychiatry

## 2020-05-01 ENCOUNTER — Other Ambulatory Visit: Payer: Self-pay

## 2020-05-01 ENCOUNTER — Telehealth (INDEPENDENT_AMBULATORY_CARE_PROVIDER_SITE_OTHER): Payer: Medicare Other | Admitting: Psychiatry

## 2020-05-01 ENCOUNTER — Encounter (HOSPITAL_COMMUNITY): Payer: Self-pay | Admitting: Psychiatry

## 2020-05-01 DIAGNOSIS — F331 Major depressive disorder, recurrent, moderate: Secondary | ICD-10-CM

## 2020-05-01 DIAGNOSIS — F1210 Cannabis abuse, uncomplicated: Secondary | ICD-10-CM | POA: Insufficient documentation

## 2020-05-01 DIAGNOSIS — F411 Generalized anxiety disorder: Secondary | ICD-10-CM | POA: Insufficient documentation

## 2020-05-01 DIAGNOSIS — F330 Major depressive disorder, recurrent, mild: Secondary | ICD-10-CM | POA: Insufficient documentation

## 2020-05-01 DIAGNOSIS — F332 Major depressive disorder, recurrent severe without psychotic features: Secondary | ICD-10-CM | POA: Insufficient documentation

## 2020-05-01 MED ORDER — ZOLPIDEM TARTRATE ER 12.5 MG PO TBCR: 12.5000 mg | EXTENDED_RELEASE_TABLET | Freq: Every evening | ORAL | 0 refills | Status: DC | PRN

## 2020-05-01 MED ORDER — BUPROPION HCL ER (XL) 150 MG PO TB24: 150.0000 mg | ORAL_TABLET | Freq: Every day | ORAL | 1 refills | Status: DC

## 2020-05-01 MED ORDER — ALPRAZOLAM 0.5 MG PO TABS: 0.5000 mg | ORAL_TABLET | Freq: Two times a day (BID) | ORAL | 1 refills | Status: DC | PRN
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
## 2020-05-08 ENCOUNTER — Ambulatory Visit (INDEPENDENT_AMBULATORY_CARE_PROVIDER_SITE_OTHER): Payer: Medicare Other | Admitting: Licensed Clinical Social Worker

## 2020-05-08 ENCOUNTER — Other Ambulatory Visit: Payer: Self-pay

## 2020-05-08 ENCOUNTER — Encounter: Payer: Self-pay | Admitting: Licensed Clinical Social Worker

## 2020-05-08 DIAGNOSIS — F331 Major depressive disorder, recurrent, moderate: Secondary | ICD-10-CM

## 2020-05-08 DIAGNOSIS — F411 Generalized anxiety disorder: Secondary | ICD-10-CM | POA: Diagnosis not present

## 2020-05-08 NOTE — Progress Notes (Signed)
Patient Location: Home  Provider Location: Home Office   Virtual Visit via Video Note  I connected with [name] on 05/08/20 at  9:00 AM EDT by a video enabled telemedicine application and verified that I am speaking with the correct person using two identifiers.   I discussed the limitations of evaluation and management by telemedicine and the availability of in person appointments. The patient expressed understanding and agreed to proceed.  THERAPY PROGRESS NOTE  Session Time: 15 Minutes  Participation Level: Active  Behavioral Response: Well GroomedAlertAnxious and Depressed  Type of Therapy: Individual Therapy  Treatment Goals addressed: Coping  Interventions: Motivational Interviewing  Summary: Cheryl Burgess is a 64 y.o. female who presents with depression and anxiety sxs. Pt reported feeling about the same since last session and difficulty sleeping. Pt had questions about her medication and was encouraged to follow up with her prescriber. Pt identified goals for therapy including "I want to feel normal again and get over some of this anger that I got".  Suicidal/Homicidal: No  Therapist Response: Therapist met with patient for first session since CCA. Therapist and patient completed treatment plan and discussed goals. Pt in agreement with plan.  Plan: Return again in 1 month.  Diagnosis: Axis I: Generalized Anxiety Disorder and MDD, Moderate, Recurrent    Axis II: N/A  Follow Up Instructions:  I discussed the treatment plan with the patient. The patient was provided an opportunity to ask questions and all were answered. The patient agreed with the plan and demonstrated an understanding of the instructions.   The patient was advised to call back or seek an in-person evaluation if the symptoms worsen or if the condition fails to improve as anticipated.  I provided 15 minutes of non-face-to-face time during this encounter.   Eustacio Ellen Wynelle Link, LCSW,  LCAS

## 2020-05-23 ENCOUNTER — Other Ambulatory Visit (HOSPITAL_COMMUNITY): Payer: Self-pay | Admitting: Psychiatry

## 2020-05-30 ENCOUNTER — Other Ambulatory Visit (HOSPITAL_COMMUNITY): Payer: Self-pay | Admitting: Psychiatry

## 2020-06-01 ENCOUNTER — Other Ambulatory Visit (HOSPITAL_COMMUNITY): Payer: Self-pay | Admitting: Surgery

## 2020-06-01 ENCOUNTER — Other Ambulatory Visit: Payer: Self-pay | Admitting: Surgery

## 2020-06-01 DIAGNOSIS — R1011 Right upper quadrant pain: Secondary | ICD-10-CM

## 2020-06-05 ENCOUNTER — Ambulatory Visit (HOSPITAL_COMMUNITY)
Admission: RE | Admit: 2020-06-05 | Discharge: 2020-06-05 | Disposition: A | Discharge status: Home / Self Care | Payer: Medicare Other | Source: Ambulatory Visit | Attending: Surgery | Admitting: Surgery

## 2020-06-05 ENCOUNTER — Other Ambulatory Visit: Payer: Self-pay

## 2020-06-05 DIAGNOSIS — R1011 Right upper quadrant pain: Secondary | ICD-10-CM | POA: Diagnosis present

## 2020-06-05 MED ORDER — GADOBUTROL 1 MMOL/ML IV SOLN
6.0000 mL | Freq: Once | INTRAVENOUS | Status: AC | PRN
  Administered 2020-06-05: 6 mL via INTRAVENOUS

## 2020-06-06 ENCOUNTER — Telehealth (INDEPENDENT_AMBULATORY_CARE_PROVIDER_SITE_OTHER): Payer: Medicare Other | Admitting: Psychiatry

## 2020-06-06 DIAGNOSIS — F1210 Cannabis abuse, uncomplicated: Secondary | ICD-10-CM

## 2020-06-06 DIAGNOSIS — F411 Generalized anxiety disorder: Secondary | ICD-10-CM | POA: Diagnosis not present

## 2020-06-06 DIAGNOSIS — F331 Major depressive disorder, recurrent, moderate: Secondary | ICD-10-CM | POA: Diagnosis not present

## 2020-06-06 IMAGING — US US EXTREM LOW*R* LIMITED
1 series · 14 of 18 positions shown · non-contrast
Comparison: No prior.

CLINICAL DATA: Right upper thigh lump, near groin.

EXAM:
ULTRASOUND RIGHT LOWER EXTREMITY LIMITED
TECHNIQUE: Ultrasound examination of the lower extremity soft tissues was
performed in the area of clinical concern.

[Series 1: us extrem low*right* limited · 18 acquisitions, 14 frames shown]
[im 1/18]
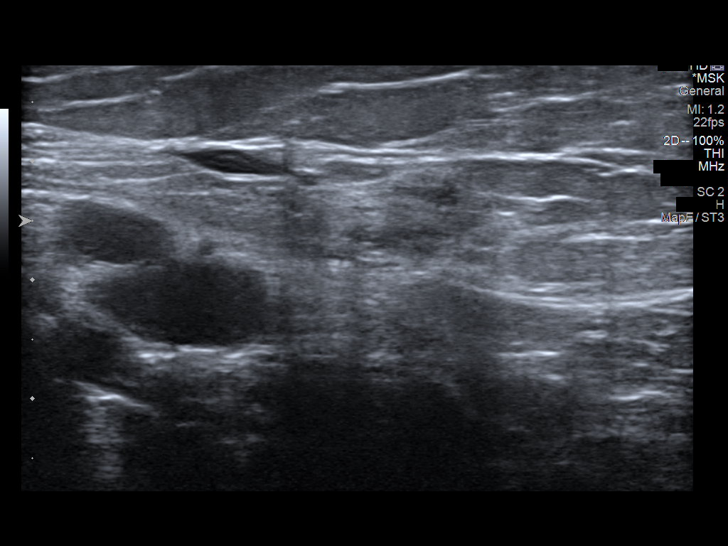
[im 2/18]
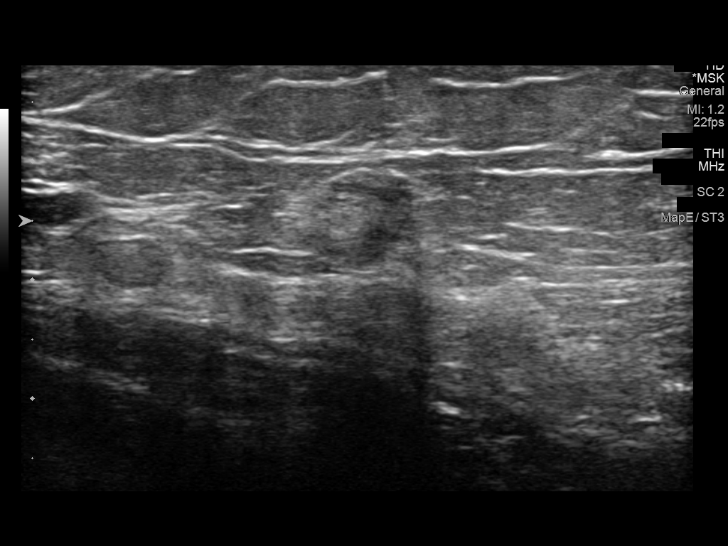
[im 4/18]
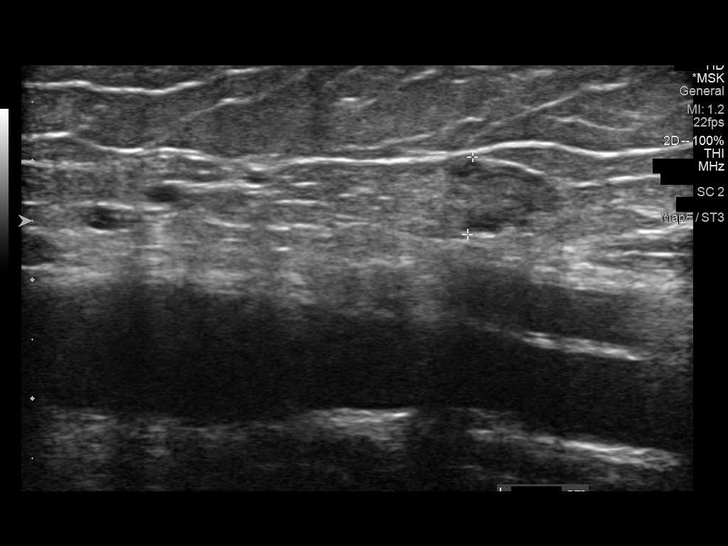
[im 5/18]
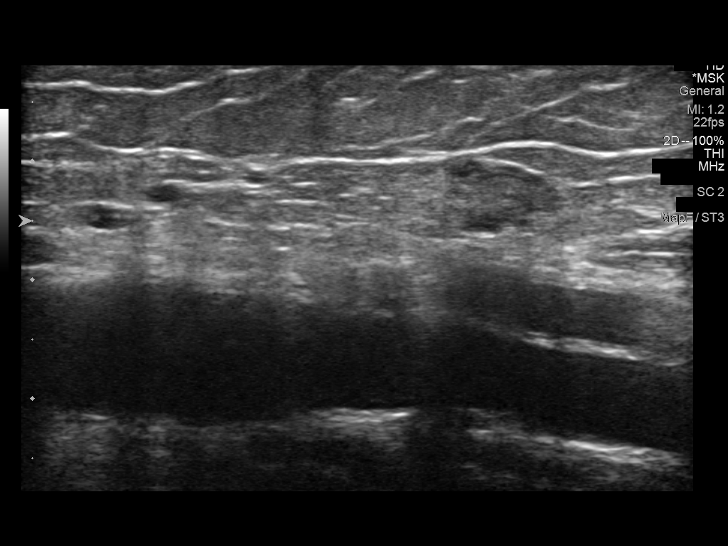
[im 6/18]
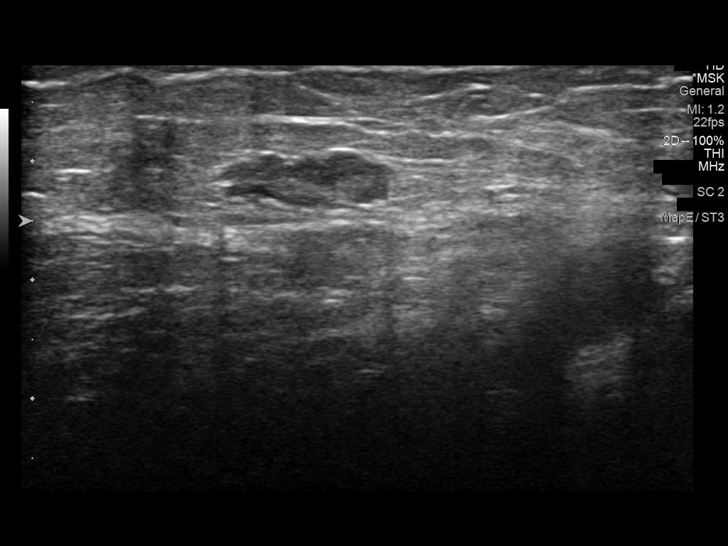
[im 8/18]
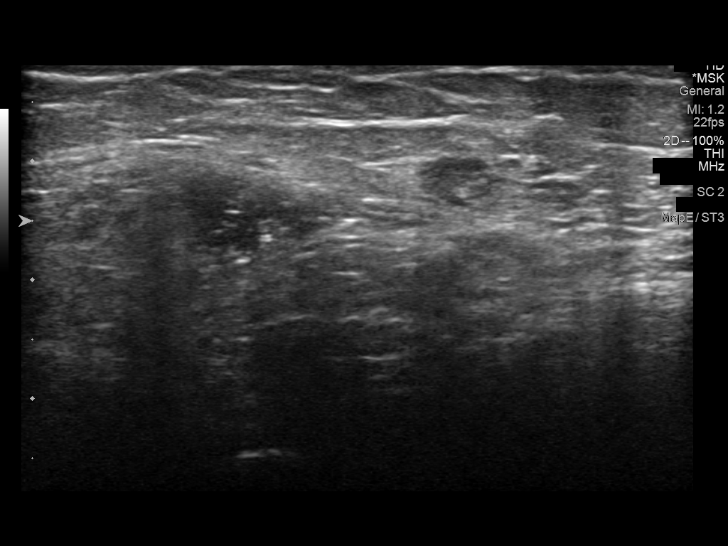
[im 9/18]
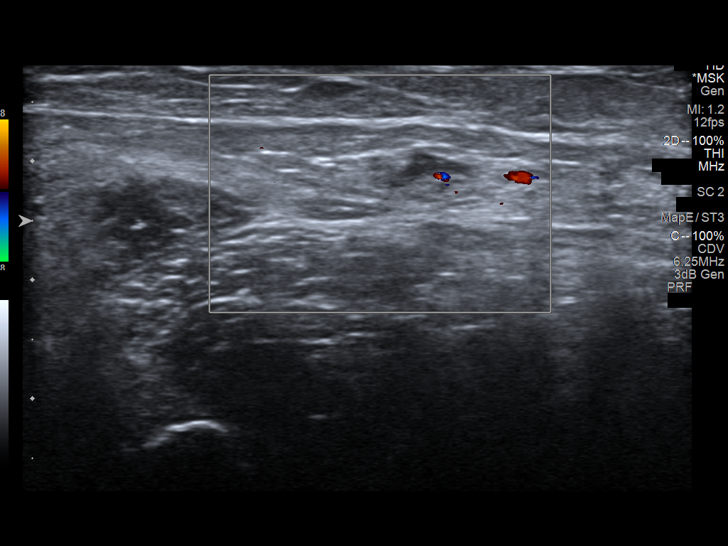
[im 10/18]
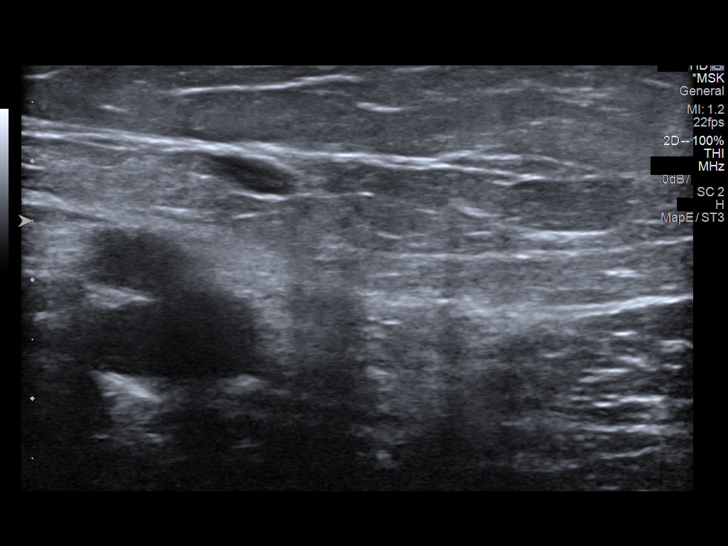
[im 11/18]
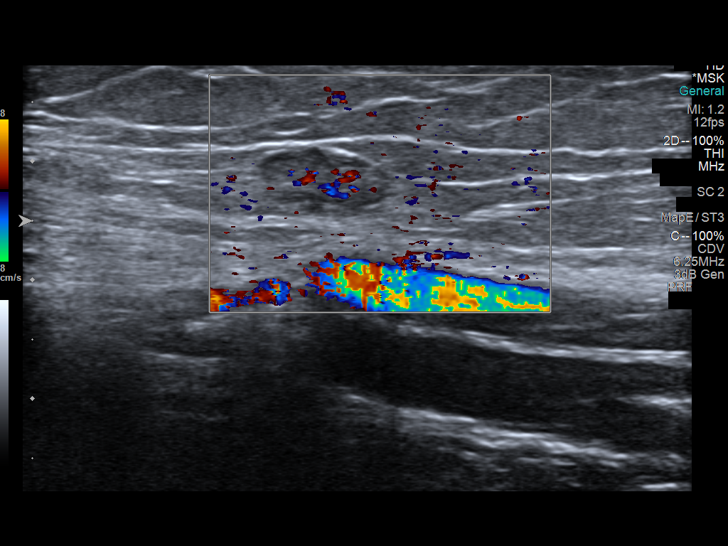
[im 13/18]
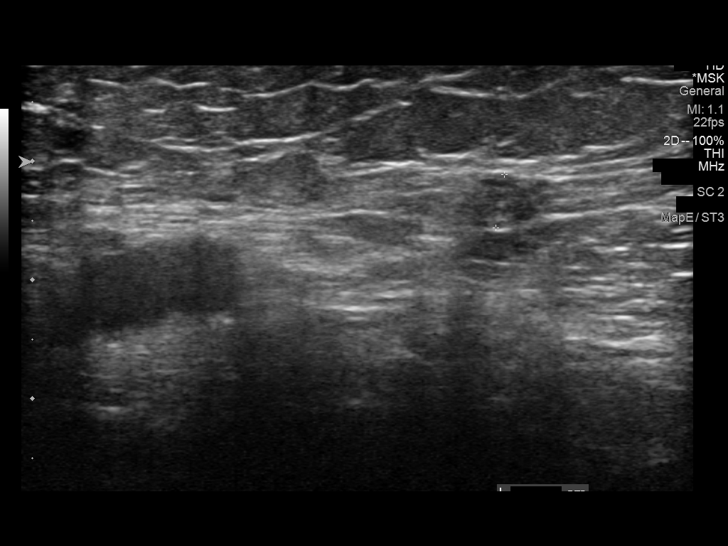
[im 14/18]
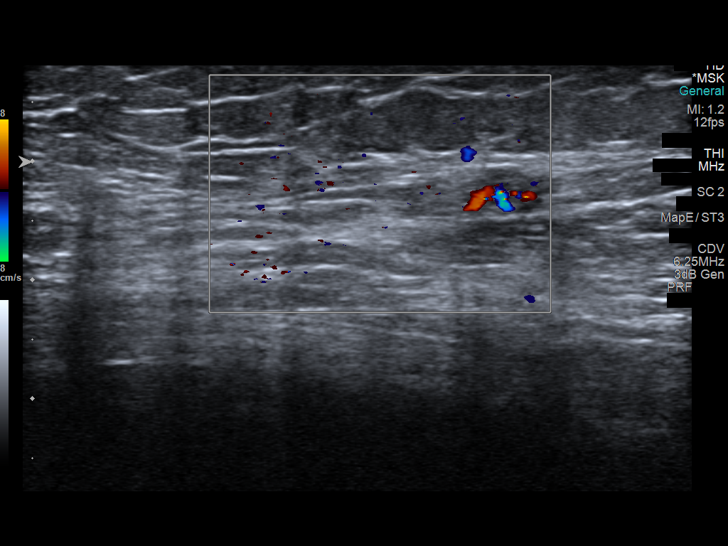
[im 15/18]
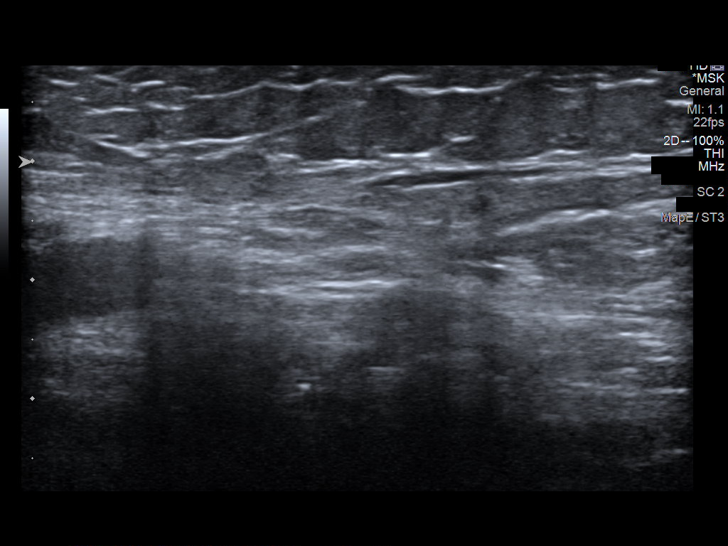
[im 17/18]
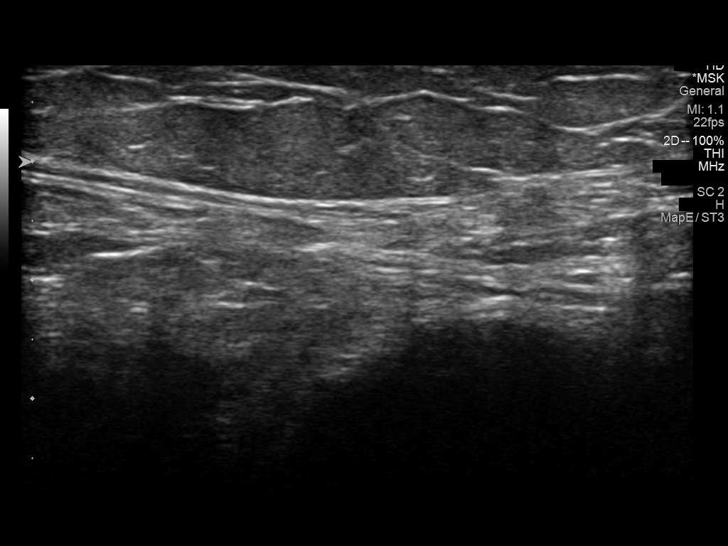
[im 18/18]
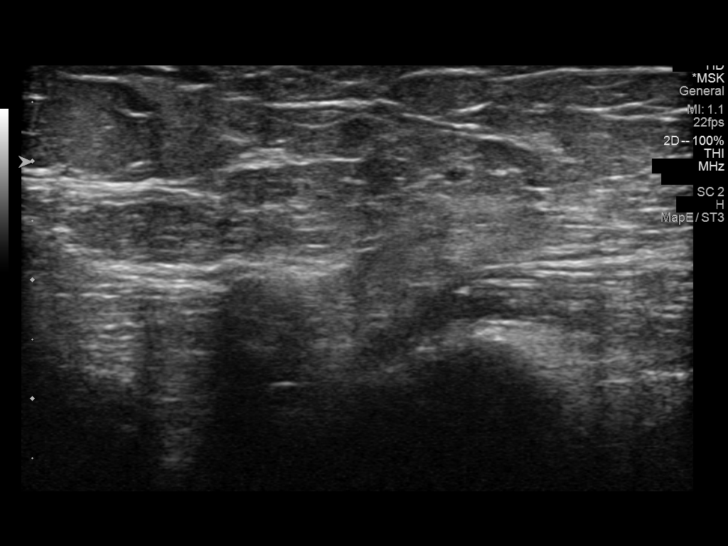

[14 of 18 positions shown; findings below may reference images not displayed]

FINDINGS: Small well-circumscribed hypoechoic foci with echogenic centers
noted in the region of clinical concern, the largest measures
cm. These are most likely small lymph nodes. No other focal
abnormality identified. No evidence of collection or hernia. Small
0.5 cm probable lymph node noted over the contralateral left side.
IMPRESSION: Small benign appearing lymph nodes noted the region of clinical
concern. Exam is otherwise unremarkable.

## 2020-06-06 MED ORDER — ALPRAZOLAM 0.5 MG PO TABS
0.5000 mg | ORAL_TABLET | Freq: Two times a day (BID) | ORAL | 1 refills | Status: DC | PRN
Start: 2020-07-04 — End: 2020-08-07

## 2020-06-06 MED ORDER — BUPROPION HCL ER (XL) 300 MG PO TB24: 300.0000 mg | ORAL_TABLET | Freq: Every day | ORAL | 1 refills | Status: DC

## 2020-06-06 MED ORDER — ESCITALOPRAM OXALATE 20 MG PO TABS: 20.0000 mg | ORAL_TABLET | Freq: Every day | ORAL | 1 refills | Status: DC

## 2020-06-06 MED ORDER — ZOLPIDEM TARTRATE ER 12.5 MG PO TBCR: 12.5000 mg | EXTENDED_RELEASE_TABLET | Freq: Every evening | ORAL | 0 refills | Status: DC | PRN

## 2020-06-06 NOTE — Progress Notes (Signed)
BH MD/PA/NP OP Progress Note  06/06/2020 9:40 AM Cheryl Burgess  MRN:  [id number] Interview was conducted using videoconferencing application and I verified that I was speaking with the correct person using two identifiers. I discussed the limitations of evaluation and management by telemedicine and  the availability of in person appointments. Patient expressed understanding and agreed to proceed. Patient location - home; physician - home office.  Chief Complaint: "My anxiety did not improve but sleep and energy did".  HPI: 64 y.o.married female with a hx of depression and anxiety. She has a hx of two IP admissions and recent (June 2021) OD on zolpidem She has tried imipramine then sertraline and duloxetine but could not tolerate sexual side. Now on escitalopram 10 mg for 6 weeks - tolerates it well but anxiety did not improve. She worries "about everything", but energy and focusing ability has improved some after addition of Wellbutrin. She feels lss depressed though and denies having SI. We have changed zolpidem IR to CR and she sleeps better (still has to take 0.5 mg of alprazolam at night to be able to fall asleep).    Visit Diagnosis:    ICD-10-CM   1. GAD (generalized anxiety disorder)  F41.1   2. Moderate episode of recurrent major depressive disorder (HCC)  F33.1   3. Cannabis abuse  F12.10     Past Psychiatric History: Please see intake H&P.  Past Medical History:  Past Medical History:  Diagnosis Date  . Ambien use disorder, mild, abuse ([place])   . Anemia, unspecified   . Atherosclerosis of aorta ([place])   . Benzodiazepine abuse ([place])   . COPD (chronic obstructive pulmonary disease) ([place])   . Depression   . Hypertension   . Hypothyroidism   . Insomnia   . Menopausal syndrome (hot flashes)   . Recurrent UTI   . RLS (restless legs syndrome)   . Superficial basal cell carcinoma (BCC) 04/19/2013   Post Right Knee (Cx3,5FU)  . Thalassemia minor     Past Surgical History:   Procedure Laterality Date  . calf implants    . COLONOSCOPY      Family Psychiatric History: Reviewed.  Family History:  Family History  Problem Relation Age of Onset  . Osteoporosis Mother   . Macular degeneration Mother   . Cancer Mother        bladder  . Alzheimer's disease Mother   . Dementia Mother   . Parkinson's disease Mother   . CAD Father   . Hypertension Father   . Diabetes Father   . Depression Father   . Kidney disease Brother   . Breast cancer Neg Hx     Social History:  Social History   Socioeconomic History  . Marital status: Married    Spouse name: Not on file  . Number of children: 2  . Years of education: Not on file  . Highest education level: Not on file  Occupational History  . Not on file  Tobacco Use  . Smoking status: Former Smoker    Packs/day: 2.00    Years: 30.00    Pack years: 60.00    Quit date: 2000    Years since quitting: 21.6  . Smokeless tobacco: Never Used  Vaping Use  . Vaping Use: Never used  Substance and Sexual Activity  . Alcohol use: Never  . Drug use: Never  . Sexual activity: Not on file  Other Topics Concern  . Not on file  Social History Narrative  .  Not on file   Social Determinants of Health   Financial Resource Strain:   . Difficulty of Paying Living Expenses: Not on file  Food Insecurity:   . Worried About Charity fundraiser in the Last Year: Not on file  . Ran Out of Food in the Last Year: Not on file  Transportation Needs:   . Lack of Transportation (Medical): Not on file  . Lack of Transportation (Non-Medical): Not on file  Physical Activity:   . Days of Exercise per Week: Not on file  . Minutes of Exercise per Session: Not on file  Stress:   . Feeling of Stress : Not on file  Social Connections:   . Frequency of Communication with Friends and Family: Not on file  . Frequency of Social Gatherings with Friends and Family: Not on file  . Attends Religious Services: Not on file  . Active  Member of Clubs or Organizations: Not on file  . Attends Archivist Meetings: Not on file  . Marital Status: Not on file    Allergies:  Allergies  Allergen Reactions  . Keflex [Cephalexin]     Metabolic Disorder Labs: No results found for: HGBA1C, MPG No results found for: PROLACTIN Lab Results  Component Value Date   CHOL 147 02/07/2020   TRIG 65 02/07/2020   HDL 69 02/07/2020   CHOLHDL 2.1 02/07/2020   LDLCALC 65 02/07/2020   No results found for: TSH  Therapeutic Level Labs: No results found for: LITHIUM No results found for: VALPROATE No components found for:  CBMZ  Current Medications: Current Outpatient Medications  Medication Sig Dispense Refill  . albuterol (VENTOLIN HFA) 108 (90 Base) MCG/ACT inhaler Inhale 2 puffs into the lungs every 6 (six) hours as needed for wheezing or shortness of breath.    . ALOE PO Take by mouth.    Derrill Memo ON 07/04/2020] ALPRAZolam (XANAX) 0.5 MG tablet Take 1 tablet (0.5 mg total) by mouth 2 (two) times daily as needed for anxiety. 60 tablet 1  . buPROPion (WELLBUTRIN XL) 300 MG 24 hr tablet Take 1 tablet (300 mg total) by mouth daily. 30 tablet 1  . Cholecalciferol (VITAMIN D3) 1.25 MG (50000 UT) CAPS Take by mouth.    . ciprofloxacin (CIPRO) 500 MG tablet Take 500 mg by mouth 2 (two) times daily.    Marland Kitchen escitalopram (LEXAPRO) 20 MG tablet Take 1 tablet (20 mg total) by mouth daily. 30 tablet 1  . estradiol (ESTRACE) 0.5 MG tablet Take 0.5 mg by mouth daily.    Marland Kitchen ezetimibe (ZETIA) 10 MG tablet Take 1 tablet (10 mg total) by mouth daily. 90 tablet 3  . gabapentin (NEURONTIN) 600 MG tablet Take 600 mg by mouth 3 (three) times daily.    Marland Kitchen levothyroxine (SYNTHROID) 75 MCG tablet Take 75 mcg by mouth daily before breakfast.    . losartan-hydrochlorothiazide (HYZAAR) 100-25 MG tablet Take 1 tablet by mouth daily.    Marland Kitchen rOPINIRole (REQUIP) 3 MG tablet Take 3 mg by mouth at bedtime.    Derrill Memo ON 07/02/2020] zolpidem (AMBIEN CR) 12.5  MG CR tablet Take 1 tablet (12.5 mg total) by mouth at bedtime as needed for sleep. 30 tablet 0   No current facility-administered medications for this visit.     Psychiatric Specialty Exam: Review of Systems  Psychiatric/Behavioral: Positive for sleep disturbance. The patient is nervous/anxious.   All other systems reviewed and are negative.   Last menstrual period 12/31/2012.There is no height  or weight on file to calculate BMI.  General Appearance: Casual  Eye Contact:  Good  Speech:  Clear and Coherent and Normal Rate  Volume:  Normal  Mood:  Anxious  Affect:  Full Range  Thought Process:  Goal Directed  Orientation:  Full (Time, Place, and Person)  Thought Content: Rumination   Suicidal Thoughts:  No  Homicidal Thoughts:  No  Memory:  Immediate;   Good Recent;   Good  Judgement:  Good  Insight:  Fair  Psychomotor Activity:  Normal  Concentration:  Concentration: Good  Recall:  Good  Fund of Knowledge: Good  Language: Good  Akathisia:  Negative  Handed:  Right  AIMS (if indicated): not done  Assets:  Communication Skills Desire for Improvement Financial Resources/Insurance Housing Intimacy Resilience Social Support  ADL's:  Intact  Cognition: WNL  Sleep:  Fair   Screenings: PHQ2-9     Counselor from 03/28/2020 in [place]  PHQ-2 Total Score 4  PHQ-9 Total Score 15       Assessment and Plan: 64 y.o.married female with a hx of depression and anxiety. She has a hx of two IP admissions and recent (June 2021) OD on zolpidem She has tried imipramine then sertraline and duloxetine but could not tolerate sexual side. Now on escitalopram 10 mg for 6 weeks - tolerates it well but anxiety did not improve. She worries "about everything", but energy and focusing ability has improved some after addition of Wellbutrin. She feels lss depressed though and denies having SI. We have changed zolpidem IR to CR and she sleeps better (still has to take 0.5  mg of alprazolam at night to be able to fall asleep).    Dx: MDD recurrent moderate; GAD; Cannabis use disorder mild  Plan: We will increase doses of both antidepressants: escitalopram to 20 mg and bupropion XL to 300 mg in am to help with fatigue, concentration. We will continue zolpidem CR and alprazolam unchanged. [name] will continue in individual counseling with Boyd Kerbs on bi-weekly basis. Next appointment with me in one month. The plan was discussed with patient who had an opportunity to ask questions and these were all answered. I spend 15 minutes in video clinical contact with the patient     Stephanie Acre, MD 06/06/2020, 9:40 AM

## 2020-06-07 ENCOUNTER — Encounter: Payer: Self-pay | Admitting: Licensed Clinical Social Worker

## 2020-06-07 ENCOUNTER — Ambulatory Visit (INDEPENDENT_AMBULATORY_CARE_PROVIDER_SITE_OTHER): Payer: Medicare Other | Admitting: Licensed Clinical Social Worker

## 2020-06-07 ENCOUNTER — Other Ambulatory Visit: Payer: Self-pay

## 2020-06-07 DIAGNOSIS — F331 Major depressive disorder, recurrent, moderate: Secondary | ICD-10-CM | POA: Diagnosis not present

## 2020-06-07 DIAGNOSIS — F411 Generalized anxiety disorder: Secondary | ICD-10-CM | POA: Diagnosis not present

## 2020-06-07 NOTE — Progress Notes (Signed)
Patient Location: Home  Provider Location: Home Office   Virtual Visit via Video Note  I connected with Cheryl Burgess on 06/07/20 at  9:00 AM EDT by a video enabled telemedicine application and verified that I am speaking with the correct person using two identifiers.   I discussed the limitations of evaluation and management by telemedicine and the availability of in person appointments. The patient expressed understanding and agreed to proceed.  THERAPY PROGRESS NOTE  Session Time: 72 Minutes  Participation Level: Active  Behavioral Response: Well GroomedAlertAnxious  Type of Therapy: Individual Therapy  Treatment Goals addressed: Anxiety and Coping  Interventions: CBT  Summary: Cheryl Burgess is a 64 y.o. female who presents with depression and anxiety sxs. Pt reported sleep is much better now and some nausea with increase in her medication. Pt identified stressors and not knowing how to communicate boundaries with others who question her traumatic event. Pt reported she does not like leaving the house. Pt reported she is still angry at herself and has been journaling regularly, which is helpful in processing some of her feelings. Pt reported she is going to listen to one of the books suggested on audible. Pt reported she is still working towards getting her "mind right" and is anxious about possibly having to testify in court. Pt reported she wants to keep sessions to once per month for now and will reach out to therapist for more sessions if needed.  Suicidal/Homicidal: No  Therapist Response: Therapist met with patient for follow up session. Therapist and patient discussed sxs, stressors and coping skills. Therapist provided validation of feelings and concerns. Pt was very receptive.  Plan: Return again in 1 month.  Diagnosis: Axis I: Generalized Anxiety Disorder and MDD, Moderate, Recurrent    Axis II: N/A  Josephine Igo, LCSW, LCAS 06/07/2020

## 2020-06-14 ENCOUNTER — Other Ambulatory Visit: Payer: Self-pay | Admitting: Gastroenterology

## 2020-06-24 ENCOUNTER — Other Ambulatory Visit (HOSPITAL_COMMUNITY): Payer: Self-pay | Admitting: Psychiatry

## 2020-06-29 ENCOUNTER — Other Ambulatory Visit (HOSPITAL_COMMUNITY): Payer: Self-pay | Admitting: Psychiatry

## 2020-06-30 ENCOUNTER — Other Ambulatory Visit (HOSPITAL_COMMUNITY): Payer: Self-pay | Admitting: Psychiatry

## 2020-06-30 ENCOUNTER — Other Ambulatory Visit (HOSPITAL_COMMUNITY)
Admission: RE | Admit: 2020-06-30 | Discharge: 2020-06-30 | Disposition: A | Discharge status: Home / Self Care | Payer: Medicare Other | Source: Ambulatory Visit | Attending: Gastroenterology | Admitting: Gastroenterology

## 2020-06-30 DIAGNOSIS — Z20822 Contact with and (suspected) exposure to covid-19: Secondary | ICD-10-CM | POA: Insufficient documentation

## 2020-06-30 DIAGNOSIS — Z01812 Encounter for preprocedural laboratory examination: Secondary | ICD-10-CM | POA: Insufficient documentation

## 2020-06-30 LAB — SARS CORONAVIRUS 2 (TAT 6-24 HRS): SARS Coronavirus 2: NEGATIVE

## 2020-07-03 ENCOUNTER — Encounter (HOSPITAL_COMMUNITY): Payer: Medicare Other

## 2020-07-04 ENCOUNTER — Encounter (HOSPITAL_COMMUNITY): Payer: Self-pay | Admitting: Gastroenterology

## 2020-07-04 ENCOUNTER — Ambulatory Visit (HOSPITAL_COMMUNITY): Payer: Medicare Other

## 2020-07-04 ENCOUNTER — Ambulatory Visit (HOSPITAL_COMMUNITY): Payer: Medicare Other | Admitting: Registered Nurse

## 2020-07-04 ENCOUNTER — Ambulatory Visit (HOSPITAL_COMMUNITY)
Admission: RE | Admit: 2020-07-04 | Discharge: 2020-07-04 | Disposition: A | Discharge status: Home / Self Care | Payer: Medicare Other | Attending: Gastroenterology | Admitting: Gastroenterology

## 2020-07-04 ENCOUNTER — Other Ambulatory Visit: Payer: Self-pay

## 2020-07-04 ENCOUNTER — Encounter (HOSPITAL_COMMUNITY)
Admission: RE | Disposition: A | Discharge status: Home / Self Care | Payer: Self-pay | Source: Home / Self Care | Attending: Gastroenterology

## 2020-07-04 DIAGNOSIS — Z85828 Personal history of other malignant neoplasm of skin: Secondary | ICD-10-CM | POA: Diagnosis not present

## 2020-07-04 DIAGNOSIS — Z8249 Family history of ischemic heart disease and other diseases of the circulatory system: Secondary | ICD-10-CM | POA: Insufficient documentation

## 2020-07-04 DIAGNOSIS — Z79890 Hormone replacement therapy (postmenopausal): Secondary | ICD-10-CM | POA: Diagnosis not present

## 2020-07-04 DIAGNOSIS — E039 Hypothyroidism, unspecified: Secondary | ICD-10-CM | POA: Diagnosis not present

## 2020-07-04 DIAGNOSIS — G2581 Restless legs syndrome: Secondary | ICD-10-CM | POA: Diagnosis not present

## 2020-07-04 DIAGNOSIS — Z881 Allergy status to other antibiotic agents status: Secondary | ICD-10-CM | POA: Diagnosis not present

## 2020-07-04 DIAGNOSIS — K8050 Calculus of bile duct without cholangitis or cholecystitis without obstruction: Secondary | ICD-10-CM

## 2020-07-04 DIAGNOSIS — J449 Chronic obstructive pulmonary disease, unspecified: Secondary | ICD-10-CM | POA: Diagnosis not present

## 2020-07-04 DIAGNOSIS — Z87891 Personal history of nicotine dependence: Secondary | ICD-10-CM | POA: Diagnosis not present

## 2020-07-04 DIAGNOSIS — F419 Anxiety disorder, unspecified: Secondary | ICD-10-CM | POA: Insufficient documentation

## 2020-07-04 DIAGNOSIS — I10 Essential (primary) hypertension: Secondary | ICD-10-CM | POA: Insufficient documentation

## 2020-07-04 DIAGNOSIS — Z7982 Long term (current) use of aspirin: Secondary | ICD-10-CM | POA: Diagnosis not present

## 2020-07-04 DIAGNOSIS — F329 Major depressive disorder, single episode, unspecified: Secondary | ICD-10-CM | POA: Diagnosis not present

## 2020-07-04 DIAGNOSIS — Z79899 Other long term (current) drug therapy: Secondary | ICD-10-CM | POA: Insufficient documentation

## 2020-07-04 DIAGNOSIS — G4700 Insomnia, unspecified: Secondary | ICD-10-CM | POA: Diagnosis not present

## 2020-07-04 DIAGNOSIS — D563 Thalassemia minor: Secondary | ICD-10-CM | POA: Insufficient documentation

## 2020-07-04 HISTORY — PX: SPHINCTEROTOMY: SHX5279

## 2020-07-04 HISTORY — PX: ENDOSCOPIC RETROGRADE CHOLANGIOPANCREATOGRAPHY (ERCP) WITH PROPOFOL: SHX5810

## 2020-07-04 HISTORY — PX: REMOVAL OF STONES: SHX5545

## 2020-07-04 SURGERY — ENDOSCOPIC RETROGRADE CHOLANGIOPANCREATOGRAPHY (ERCP) WITH PROPOFOL
Anesthesia: General

## 2020-07-04 MED ORDER — PHENYLEPHRINE 40 MCG/ML (10ML) SYRINGE FOR IV PUSH (FOR BLOOD PRESSURE SUPPORT)
PREFILLED_SYRINGE | INTRAVENOUS | Status: DC | PRN
  Administered 2020-07-04 (×4): 120 ug via INTRAVENOUS

## 2020-07-04 MED ORDER — PROPOFOL 500 MG/50ML IV EMUL
INTRAVENOUS | Status: AC
  Filled 2020-07-04: qty 50

## 2020-07-04 MED ORDER — GLUCAGON HCL RDNA (DIAGNOSTIC) 1 MG IJ SOLR
INTRAMUSCULAR | Status: AC
  Filled 2020-07-04: qty 1

## 2020-07-04 MED ORDER — SODIUM CHLORIDE 0.9 % IV SOLN: INTRAVENOUS | Status: DC

## 2020-07-04 MED ORDER — EPHEDRINE SULFATE-NACL 50-0.9 MG/10ML-% IV SOSY
PREFILLED_SYRINGE | INTRAVENOUS | Status: DC | PRN
  Administered 2020-07-04: 10 mg via INTRAVENOUS
  Administered 2020-07-04: 15 mg via INTRAVENOUS
  Administered 2020-07-04: 10 mg via INTRAVENOUS
  Administered 2020-07-04: 15 mg via INTRAVENOUS
  Administered 2020-07-04: 10 mg via INTRAVENOUS

## 2020-07-04 MED ORDER — PROPOFOL 10 MG/ML IV BOLUS
INTRAVENOUS | Status: AC
  Filled 2020-07-04: qty 20

## 2020-07-04 MED ORDER — GLUCAGON HCL RDNA (DIAGNOSTIC) 1 MG IJ SOLR
INTRAMUSCULAR | Status: DC | PRN
  Administered 2020-07-04 (×2): .5 mg via INTRAVENOUS

## 2020-07-04 MED ORDER — DEXAMETHASONE SODIUM PHOSPHATE 10 MG/ML IJ SOLN
INTRAMUSCULAR | Status: DC | PRN
  Administered 2020-07-04: 5 mg via INTRAVENOUS

## 2020-07-04 MED ORDER — CIPROFLOXACIN IN D5W 400 MG/200ML IV SOLN
INTRAVENOUS | Status: DC | PRN
  Administered 2020-07-04: 400 mg via INTRAVENOUS

## 2020-07-04 MED ORDER — MIDAZOLAM HCL 2 MG/2ML IJ SOLN
INTRAMUSCULAR | Status: AC
  Filled 2020-07-04: qty 2

## 2020-07-04 MED ORDER — SUCCINYLCHOLINE CHLORIDE 200 MG/10ML IV SOSY
PREFILLED_SYRINGE | INTRAVENOUS | Status: DC | PRN
  Administered 2020-07-04: 100 mg via INTRAVENOUS

## 2020-07-04 MED ORDER — LACTATED RINGERS IV SOLN
INTRAVENOUS | Status: DC
  Administered 2020-07-04: 1000 mL via INTRAVENOUS

## 2020-07-04 MED ORDER — ONDANSETRON HCL 4 MG/2ML IJ SOLN
INTRAMUSCULAR | Status: DC | PRN
  Administered 2020-07-04: 4 mg via INTRAVENOUS

## 2020-07-04 MED ORDER — FENTANYL CITRATE (PF) 250 MCG/5ML IJ SOLN
INTRAMUSCULAR | Status: DC | PRN
Start: 2020-07-04 — End: 2020-07-04
  Administered 2020-07-04 (×2): 50 ug via INTRAVENOUS

## 2020-07-04 MED ORDER — INDOMETHACIN 50 MG RE SUPP
RECTAL | Status: DC | PRN
  Administered 2020-07-04: 100 mg via RECTAL

## 2020-07-04 MED ORDER — SCOPOLAMINE 1 MG/3DAYS TD PT72
MEDICATED_PATCH | TRANSDERMAL | Status: AC
  Filled 2020-07-04: qty 1

## 2020-07-04 MED ORDER — PROPOFOL 10 MG/ML IV BOLUS
INTRAVENOUS | Status: DC | PRN
  Administered 2020-07-04: 120 mg via INTRAVENOUS

## 2020-07-04 MED ORDER — SCOPOLAMINE 1 MG/3DAYS TD PT72
1.0000 | MEDICATED_PATCH | TRANSDERMAL | Status: DC
  Administered 2020-07-04: 1.5 mg via TRANSDERMAL

## 2020-07-04 MED ORDER — CIPROFLOXACIN IN D5W 400 MG/200ML IV SOLN
INTRAVENOUS | Status: AC
  Filled 2020-07-04: qty 200

## 2020-07-04 MED ORDER — FENTANYL CITRATE (PF) 100 MCG/2ML IJ SOLN
INTRAMUSCULAR | Status: AC
  Filled 2020-07-04: qty 2

## 2020-07-04 MED ORDER — LIDOCAINE 2% (20 MG/ML) 5 ML SYRINGE
INTRAMUSCULAR | Status: DC | PRN
  Administered 2020-07-04: 40 mg via INTRAVENOUS

## 2020-07-04 MED ORDER — IOPAMIDOL (ISOVUE-M 300) INJECTION 61%
INTRAMUSCULAR | Status: DC | PRN
  Administered 2020-07-04: 15 mL via INTRATHECAL

## 2020-07-04 MED ORDER — INDOMETHACIN 50 MG RE SUPP
RECTAL | Status: AC
  Filled 2020-07-04: qty 1

## 2020-07-04 MED ORDER — MIDAZOLAM HCL 5 MG/5ML IJ SOLN
INTRAMUSCULAR | Status: DC | PRN
  Administered 2020-07-04: 2 mg via INTRAVENOUS

## 2020-07-04 NOTE — Op Note (Signed)
[facility] Patient Name: Cheryl Burgess Procedure Date: 07/04/2020 MRN: [id number] Attending MD: Ronnette Juniper , MD Date of Birth: 1956-05-19 CSN: [id number] Age: 64 Admit Type: Outpatient Procedure:                ERCP Indications:              Common bile duct stone, 5 mm noted on MRCP from 8/21 Providers:                Ronnette Juniper, MD, Baird Cancer, RN, Ladona Ridgel,                            Technician, Marla Roe, CRNA Referring MD:             Mayra Neer, MD Medicines:                Monitored Anesthesia Care Complications:            No immediate complications. Estimated blood loss:                            None Estimated Blood Loss:     Estimated blood loss: none. Procedure:                Pre-Anesthesia Assessment:                           - Prior to the procedure, a History and Physical                            was performed, and patient medications and                            allergies were reviewed. The patient's tolerance of                            previous anesthesia was also reviewed. The risks                            and benefits of the procedure and the sedation                            options and risks were discussed with the patient.                            All questions were answered, and informed consent                            was obtained. Prior Anticoagulants: The patient has                            taken no previous anticoagulant or antiplatelet                            agents. ASA Grade Assessment: II - A patient with  mild systemic disease. After reviewing the risks                            and benefits, the patient was deemed in                            satisfactory condition to undergo the procedure.                           After obtaining informed consent, the scope was                            passed under direct vision. Throughout the                             procedure, the patient's blood pressure, pulse, and                            oxygen saturations were monitored continuously. The                            TJF-Q180V ([id number]) Olympus Duodenoscope was                            introduced through the mouth, and used to inject                            contrast into and used to cannulate the bile duct.                            The ERCP was accomplished without difficulty. The                            patient tolerated the procedure well. Scope In: Scope Out: Findings:      The scout film was normal. The esophagus was successfully intubated       under direct vision. The scope was advanced to a normal major papilla in       the descending duodenum without detailed examination of the pharynx,       larynx and associated structures, and upper GI tract. The upper GI tract       was grossly normal.      Canulation of CBD was extremely difficult as the ampulla was downward       facing. After extreme bowing, the wire was noted in the pancreatic duct.       However, the wire fell off. Thereafter, despite multiple attempts and       extreme bowing of the tip of the sphinctertome, the CBD could not be       canulated. The sphinctertome was then exchnaged for a tapered       sphinctertome with jagwire(stiff).      The bile duct was deeply cannulated with the tapered sphincterotome.       Contrast was injected. I personally interpreted the bile duct images.       There was brisk flow of contrast through the ducts. Image quality was  excellent. Contrast extended to the main bile duct. The main bile duct       was moderately dilated. The largest diameter was 12 mm. A short 0.025       inch Antonietta Breach was passed into the biliary tree.      A 10 mm biliary sphincterotomy was made with a braided tapered       sphincterotome using ERBE electrocautery.      There was no post-sphincterotomy bleeding. The biliary tree was swept       with a 12 mm  balloon starting at the bifurcation. Nothing was found. Impression:               - The entire main bile duct was moderately dilated.                           - A biliary sphincterotomy was performed.                           - The biliary tree was swept and nothing was found.                           - The wire was in the pancreatic duct once, but the                            pancreatic duct was not injected and the patient                            had been given indomethacin suppository before the                            start of the procedure. Moderate Sedation:      Patient did not receive moderate sedation for this procedure, but       instead received monitored anesthesia care. Recommendation:           - Advance diet as tolerated. Procedure Code(s):        --- Professional ---                           [phone], Endoscopic retrograde                            cholangiopancreatography (ERCP); with                            sphincterotomy/papillotomy Diagnosis Code(s):        --- Professional ---                           K80.50, Calculus of bile duct without cholangitis                            or cholecystitis without obstruction                           K83.8, Other specified diseases of biliary tract CPT copyright 2019 American Medical Association. All rights reserved. The codes documented in this report  are preliminary and upon coder review may  be revised to meet current compliance requirements. Ronnette Juniper, MD 07/04/2020 2:25:29 PM This report has been signed electronically. Number of Addenda: 0

## 2020-07-04 NOTE — Anesthesia Preprocedure Evaluation (Addendum)
Anesthesia Evaluation  Patient identified by MRN, date of birth, ID band Patient awake    Reviewed: Allergy & Precautions, NPO status , Patient's Chart, lab work & pertinent test results  History of Anesthesia Complications Negative for: history of anesthetic complications  Airway Mallampati: I  TM Distance: >3 FB Neck ROM: Full    Dental  (+) Dental Advisory Given, Teeth Intact   Pulmonary COPD,  COPD inhaler, former smoker,  06/30/2020 SARS coronavirus NEG   breath sounds clear to auscultation       Cardiovascular hypertension, Pt. on medications (-) angina Rhythm:Regular Rate:Normal  10/2019 ECHO: EF 96-22%, grade 2 diastolic dysfunction, no significant valvular abnormalities   Neuro/Psych Anxiety Depression negative neurological ROS     GI/Hepatic GERD  Medicated and Controlled,Common bile duct stone No nausea/vomiting in over a week   Endo/Other  Hypothyroidism   Renal/GU negative Renal ROS     Musculoskeletal   Abdominal   Peds  Hematology negative hematology ROS (+)   Anesthesia Other Findings   Reproductive/Obstetrics                           Anesthesia Physical Anesthesia Plan  ASA: II  Anesthesia Plan: General   Post-op Pain Management:    Induction: Intravenous  PONV Risk Score and Plan: 3 and Ondansetron, Dexamethasone, Treatment may vary due to age or medical condition and Scopolamine patch - Pre-op  Airway Management Planned: Oral ETT  Additional Equipment: None  Intra-op Plan:   Post-operative Plan: Extubation in OR  Informed Consent: I have reviewed the patients History and Physical, chart, labs and discussed the procedure including the risks, benefits and alternatives for the proposed anesthesia with the patient or authorized representative who has indicated his/her understanding and acceptance.     Dental advisory given  Plan Discussed with: CRNA and  Surgeon  Anesthesia Plan Comments:        Anesthesia Quick Evaluation

## 2020-07-04 NOTE — Brief Op Note (Signed)
07/04/2020  2:25 PM  PATIENT:  Cheryl Burgess  64 y.o. female  PRE-OPERATIVE DIAGNOSIS:  Choledocholithiasis  POST-OPERATIVE DIAGNOSIS:  Sphincterotomy, balloon sweep-no stones seen  PROCEDURE:  Procedure(s): ENDOSCOPIC RETROGRADE CHOLANGIOPANCREATOGRAPHY (ERCP) WITH PROPOFOL (N/A) REMOVAL OF STONES SPHINCTEROTOMY  SURGEON:  Surgeon(s) and Role:    Ronnette Juniper, MD - Primary  PHYSICIAN ASSISTANT:   ASSISTANTS: Lynett Fish, Myrlene Broker Proctor,Tech ANESTHESIA:   MAC  EBL:  None  BLOOD ADMINISTERED:none  DRAINS: none   LOCAL MEDICATIONS USED:  NONE  SPECIMEN:  No Specimen  DISPOSITION OF SPECIMEN: none  COUNTS:  YES  TOURNIQUET:  * No tourniquets in log *  DICTATION: .Dragon Dictation  PLAN OF CARE: Admit to inpatient   PATIENT DISPOSITION:  PACU - hemodynamically stable.   Delay start of Pharmacological VTE agent (>24hrs) due to surgical blood loss or risk of bleeding: not applicable

## 2020-07-04 NOTE — Discharge Instructions (Signed)
Endoscopic Retrograde Cholangiopancreatogram, Care After This sheet gives you information about how to care for yourself after your procedure. Your health care provider may also give you more specific instructions. If you have problems or questions, contact your health care provider. What can I expect after the procedure? After the procedure, it is common to have:  Soreness in your throat.  Nausea.  Bloating.  Dizziness.  Tiredness (fatigue). Follow these instructions at home:   Take over-the-counter and prescription medicines only as told by your health care provider.  Do not drive for 24 hours if you were given a medicine to help you relax (sedative) during your procedure. Have someone stay with you for 24 hours after the procedure.  Return to your normal activities as told by your health care provider. Ask your health care provider what activities are safe for you.  Return to eating what you normally do as soon as you feel well enough or as told by your health care provider.  Keep all follow-up visits as told by your health care provider. This is important. Contact a health care provider if:  You have pain in your abdomen that does not get better with medicine.  You develop signs of infection, such as: ? Chills. ? Feeling unwell. Get help right away if:  You have difficulty swallowing.  You have worsening pain in your throat, chest, or abdomen.  You vomit bright red blood or a substance that looks like coffee grounds.  You have bloody or very black stools.  You have a fever.  You have a sudden increase in swelling (bloating) in your abdomen. Summary  After the procedure, it is common to feel tired and to have some discomfort in your throat.  Contact your health care provider if you have signs of infection--such as chills or feeling unwell--or if you have pain that does not improve with medicine.  Get help right away if you have trouble swallowing, worsening  pain, bloody or black vomit, bloody or black stools, a fever, or increased swelling in your abdomen.  Keep all follow-up visits as told by your health care provider. This is important. This information is not intended to replace advice given to you by your health care provider. Make sure you discuss any questions you have with your health care provider. Document Revised: 09/04/2017 Document Reviewed: 08/11/2016 Elsevier Patient Education  2020 [employer]vier Inc.  

## 2020-07-04 NOTE — Anesthesia Procedure Notes (Signed)
Procedure Name: Intubation Date/Time: 07/04/2020 1:14 PM Performed by: Talbot Grumbling, CRNA Pre-anesthesia Checklist: Patient identified, Emergency Drugs available, Suction available and Patient being monitored Patient Re-evaluated:Patient Re-evaluated prior to induction Oxygen Delivery Method: Circle system utilized Preoxygenation: Pre-oxygenation with 100% oxygen Induction Type: IV induction, Cricoid Pressure applied and Rapid sequence Laryngoscope Size: Mac and 3 Grade View: Grade III Tube type: Oral Tube size: 7.0 mm Number of attempts: 1 Airway Equipment and Method: Stylet Placement Confirmation: ETT inserted through vocal cords under direct vision,  positive ETCO2 and breath sounds checked- equal and bilateral Secured at: 21 cm Tube secured with: Tape Dental Injury: Teeth and Oropharynx as per pre-operative assessment

## 2020-07-04 NOTE — Anesthesia Postprocedure Evaluation (Signed)
Anesthesia Post Note  Patient: Cheryl Burgess  Procedure(s) Performed: ENDOSCOPIC RETROGRADE CHOLANGIOPANCREATOGRAPHY (ERCP) WITH PROPOFOL (N/A ) REMOVAL OF STONES SPHINCTEROTOMY     Patient location during evaluation: Endoscopy Anesthesia Type: General Level of consciousness: awake and alert, patient cooperative and oriented Pain management: pain level controlled Vital Signs Assessment: post-procedure vital signs reviewed and stable Respiratory status: spontaneous breathing, nonlabored ventilation and respiratory function stable Cardiovascular status: stable and blood pressure returned to baseline Postop Assessment: no apparent nausea or vomiting Anesthetic complications: no   No complications documented.  Last Vitals:  Vitals:   07/04/20 1500 07/04/20 1510  BP: (!) 171/63 (!) 162/72  Pulse: 73 70  Resp: 15 18  Temp:    SpO2: 100% 99%    Last Pain:  Vitals:   07/04/20 1510  TempSrc:   PainSc: 0-No pain                 Mackenzie Groom,E. Mattison Golay

## 2020-07-04 NOTE — Transfer of Care (Signed)
Immediate Anesthesia Transfer of Care Note  Patient: Cheryl Burgess  Procedure(s) Performed: ENDOSCOPIC RETROGRADE CHOLANGIOPANCREATOGRAPHY (ERCP) WITH PROPOFOL (N/A ) REMOVAL OF STONES SPHINCTEROTOMY  Patient Location: PACU and Endoscopy Unit  Anesthesia Type:General  Level of Consciousness: drowsy, patient cooperative and responds to stimulation  Airway & Oxygen Therapy: Patient Spontanous Breathing and Patient connected to face mask oxygen  Post-op Assessment: Report given to RN and Post -op Vital signs reviewed and stable  Post vital signs: Reviewed and stable  Last Vitals:  Vitals Value Taken Time  BP 175/65 07/04/20 1441  Temp    Pulse 78 07/04/20 1443  Resp 12 07/04/20 1443  SpO2 100 % 07/04/20 1443  Vitals shown include unvalidated device data.  Last Pain:  Vitals:   07/04/20 1147  TempSrc: Oral  PainSc: 0-No pain         Complications: No complications documented.

## 2020-07-04 NOTE — H&P (Signed)
Cheryl Burgess is an 64 y.o. female.   Chief Complaint: 5 mm calculus in distal common bile duct HPI: Abnormal MRCP from 06/05/2020  Past Medical History:  Diagnosis Date  . Ambien use disorder, mild, abuse ([place])   . Anemia, unspecified   . Atherosclerosis of aorta ([place])   . Benzodiazepine abuse ([place])   . COPD (chronic obstructive pulmonary disease) ([place])   . Depression   . Hypertension   . Hypothyroidism   . Insomnia   . Menopausal syndrome (hot flashes)   . Recurrent UTI   . RLS (restless legs syndrome)   . Superficial basal cell carcinoma (BCC) 04/19/2013   Post Right Knee (Cx3,5FU)  . Thalassemia minor     Past Surgical History:  Procedure Laterality Date  . calf implants    . COLONOSCOPY      Family History  Problem Relation Age of Onset  . Osteoporosis Mother   . Macular degeneration Mother   . Cancer Mother        bladder  . Alzheimer's disease Mother   . Dementia Mother   . Parkinson's disease Mother   . CAD Father   . Hypertension Father   . Diabetes Father   . Depression Father   . Kidney disease Brother   . Breast cancer Neg Hx    Social History:  reports that she quit smoking about 21 years ago. She has a 60.00 pack-year smoking history. She has never used smokeless tobacco. She reports that she does not drink alcohol and does not use drugs.  Allergies:  Allergies  Allergen Reactions  . Keflex [Cephalexin] Itching and Rash    Medications Prior to Admission  Medication Sig Dispense Refill  . ALPRAZolam (XANAX) 0.5 MG tablet Take 1 tablet (0.5 mg total) by mouth 2 (two) times daily as needed for anxiety. 60 tablet 1  . aspirin-sod bicarb-citric acid (ALKA-SELTZER) 325 MG TBEF tablet Take 325 mg by mouth every 6 (six) hours as needed (acid relief).    . bifidobacterium infantis (ALIGN) capsule Take 1 capsule by mouth daily.    Marland Kitchen CALCIUM PO Take 1 tablet by mouth daily.    . Cholecalciferol (VITAMIN D3) 1.25 MG (50000 UT) CAPS Take 5,000 Units by  mouth daily.     Marland Kitchen ezetimibe (ZETIA) 10 MG tablet Take 1 tablet (10 mg total) by mouth daily. 90 tablet 3  . levothyroxine (SYNTHROID) 75 MCG tablet Take 75 mcg by mouth daily before breakfast.    . losartan-hydrochlorothiazide (HYZAAR) 100-25 MG tablet Take 1 tablet by mouth daily.    Marland Kitchen omeprazole (PRILOSEC) 40 MG capsule Take 40 mg by mouth daily.    . ondansetron (ZOFRAN) 8 MG tablet Take 8 mg by mouth 2 (two) times daily as needed for nausea/vomiting.    Marland Kitchen OVER THE COUNTER MEDICATION Take 1 tablet by mouth daily. [place] Aloe    . progesterone (PROMETRIUM) 100 MG capsule Take 100 mg by mouth at bedtime.    Marland Kitchen rOPINIRole (REQUIP) 3 MG tablet Take 3 mg by mouth at bedtime.    . rosuvastatin (CRESTOR) 5 MG tablet Take 5 mg by mouth daily.    . Simethicone 125 MG CAPS Take 125 mg by mouth 3 (three) times daily as needed (flatulence).    Marland Kitchen sulfamethoxazole-trimethoprim (BACTRIM DS) 800-160 MG tablet Take 1 tablet by mouth 2 (two) times daily.    Marland Kitchen albuterol (VENTOLIN HFA) 108 (90 Base) MCG/ACT inhaler Inhale 2 puffs into the lungs every 6 (six) hours as  needed for wheezing or shortness of breath. (Patient not taking: Reported on 06/26/2020)    . ALOE PO Take by mouth. (Patient not taking: Reported on 06/26/2020)    . buPROPion (WELLBUTRIN XL) 300 MG 24 hr tablet TAKE 1 TABLET BY MOUTH EVERY DAY 90 tablet 1  . ciprofloxacin (CIPRO) 500 MG tablet Take 500 mg by mouth 2 (two) times daily. (Patient not taking: Reported on 06/26/2020)    . escitalopram (LEXAPRO) 20 MG tablet TAKE 1 TABLET BY MOUTH EVERY DAY 90 tablet 1  . estradiol (ESTRACE) 0.5 MG tablet Take 0.5 mg by mouth daily. (Patient not taking: Reported on 06/26/2020)    . gabapentin (NEURONTIN) 600 MG tablet Take 600 mg by mouth 3 (three) times daily. (Patient not taking: Reported on 06/26/2020)    . zolpidem (AMBIEN CR) 12.5 MG CR tablet TAKE 1 TABLET BY MOUTH AT BEDTIME AS NEEDED FOR SLEEP 30 tablet 0    No results found for this or any previous  visit (from the past 48 hour(s)). No results found.  Review of Systems  Constitutional: Negative.   HENT: Negative.   Eyes: Negative.   Respiratory: Negative.   Cardiovascular: Negative.   Gastrointestinal: Positive for abdominal pain, constipation, diarrhea and nausea.  Endocrine: Negative.   Genitourinary: Negative.   Musculoskeletal: Negative.   Skin: Negative.   Neurological: Negative.   Hematological: Negative.     Last menstrual period 12/31/2012. Physical Exam Constitutional:      Appearance: Normal appearance.  HENT:     Head: Normocephalic and atraumatic.  Eyes:     Conjunctiva/sclera: Conjunctivae normal.  Cardiovascular:     Rate and Rhythm: Normal rate and regular rhythm.  Pulmonary:     Effort: Pulmonary effort is normal.  Abdominal:     General: Abdomen is flat.     Palpations: Abdomen is soft.  Musculoskeletal:        General: Normal range of motion.  Skin:    General: Skin is warm and dry.  Neurological:     Mental Status: She is alert.      Assessment/Plan Abnormal MRCP showing distal CBD stone ERCP with sphincterotomy and stone extraction The risks and benefits of the procedure were discussed with the patient in details. She understands and verbalizes consent.  Ronnette Juniper, MD 07/04/2020, 11:10 AM

## 2020-07-05 ENCOUNTER — Encounter (HOSPITAL_COMMUNITY): Payer: Self-pay | Admitting: Gastroenterology

## 2020-07-10 ENCOUNTER — Telehealth (INDEPENDENT_AMBULATORY_CARE_PROVIDER_SITE_OTHER): Payer: Medicare Other | Admitting: Psychiatry

## 2020-07-10 ENCOUNTER — Other Ambulatory Visit: Payer: Self-pay

## 2020-07-10 ENCOUNTER — Other Ambulatory Visit (HOSPITAL_COMMUNITY): Payer: Medicare Other

## 2020-07-10 DIAGNOSIS — F411 Generalized anxiety disorder: Secondary | ICD-10-CM

## 2020-07-10 DIAGNOSIS — F330 Major depressive disorder, recurrent, mild: Secondary | ICD-10-CM | POA: Diagnosis not present

## 2020-07-10 MED ORDER — VILAZODONE HCL 10 MG PO TABS: ORAL_TABLET | ORAL | 0 refills | Status: DC

## 2020-07-10 MED ORDER — ZOLPIDEM TARTRATE ER 12.5 MG PO TBCR
12.5000 mg | EXTENDED_RELEASE_TABLET | Freq: Every evening | ORAL | 1 refills | Status: DC | PRN
Start: 2020-07-28 — End: 2020-09-04

## 2020-07-10 NOTE — Progress Notes (Signed)
[place] MD/PA/NP OP Progress Note  07/10/2020 9:46 AM Cheryl Burgess  MRN:  [id number] Interview was conducted using videoconferencing application and I verified that I was speaking with the correct person using two identifiers. I discussed the limitations of evaluation and management by telemedicine and  the availability of in person appointments. Patient expressed understanding and agreed to proceed. Patient location - home; physician - home office.  Chief Complaint: Anxiety and side effects from increased dose of Lexapro.  HPI: 64 y.o.marriedfemalewith a hx of depression and anxiety. She has a hx of two IP admissions and recent (June 2021) OD on zolpidem She has tried imipramine then sertraline and duloxetine but could not tolerate sexual side. Now on escitalopram 20 mg - she tolerated 10 mg well but it did not help with anxiety. After dose was increased to 20 mg she started to experience headaches, loss of libido and nausea. Anxiety did not improve either. She worries "about everything", but energy and focusing ability has improved after addition of Wellbutrin. She has been feeling less depressed and denies having SI. We have changed zolpidem IR to CR and she sleeps better (still has to take 0.5 mg of alprazolam at night to be able to fall asleep).     Visit Diagnosis:    ICD-10-CM   1. GAD (generalized anxiety disorder)  F41.1   2. Major depressive disorder, recurrent episode, mild (HCC)  F33.0     Past Psychiatric History: Please see intake H&P.  Past Medical History:  Past Medical History:  Diagnosis Date  . Ambien use disorder, mild, abuse ([place])   . Anemia, unspecified   . Atherosclerosis of aorta ([place])   . Benzodiazepine abuse ([place])   . COPD (chronic obstructive pulmonary disease) ([place])   . Depression   . Hypertension   . Hypothyroidism   . Insomnia   . Menopausal syndrome (hot flashes)   . Recurrent UTI   . RLS (restless legs syndrome)   . Superficial basal cell carcinoma  (BCC) 04/19/2013   Post Right Knee (Cx3,5FU)  . Thalassemia minor     Past Surgical History:  Procedure Laterality Date  . calf implants    . COLONOSCOPY    . ENDOSCOPIC RETROGRADE CHOLANGIOPANCREATOGRAPHY (ERCP) WITH PROPOFOL N/A 07/04/2020   Procedure: ENDOSCOPIC RETROGRADE CHOLANGIOPANCREATOGRAPHY (ERCP) WITH PROPOFOL;  Surgeon: Ronnette Juniper, MD;  Location: [facility] ENDOSCOPY;  Service: Gastroenterology;  Laterality: N/A;  . REMOVAL OF STONES  07/04/2020   Procedure: REMOVAL OF STONES;  Surgeon: Ronnette Juniper, MD;  Location: [facility] ENDOSCOPY;  Service: Gastroenterology;;  . Joan Mayans  07/04/2020   Procedure: SPHINCTEROTOMY;  Surgeon: Ronnette Juniper, MD;  Location: [facility] ENDOSCOPY;  Service: Gastroenterology;;    Family Psychiatric History: Reviewed.  Family History:  Family History  Problem Relation Age of Onset  . Osteoporosis Mother   . Macular degeneration Mother   . Cancer Mother        bladder  . Alzheimer's disease Mother   . Dementia Mother   . Parkinson's disease Mother   . CAD Father   . Hypertension Father   . Diabetes Father   . Depression Father   . Kidney disease Brother   . Breast cancer Neg Hx     Social History:  Social History   Socioeconomic History  . Marital status: Married    Spouse name: Not on file  . Number of children: 2  . Years of education: Not on file  . Highest education level: Not on file  Occupational History  .  Not on file  Tobacco Use  . Smoking status: Former Smoker    Packs/day: 2.00    Years: 30.00    Pack years: 60.00    Quit date: 2000    Years since quitting: 21.7  . Smokeless tobacco: Never Used  Vaping Use  . Vaping Use: Never used  Substance and Sexual Activity  . Alcohol use: Never  . Drug use: Never  . Sexual activity: Not on file  Other Topics Concern  . Not on file  Social History Narrative  . Not on file   Social Determinants of Health   Financial Resource Strain:   . Difficulty of Paying Living Expenses: Not on  file  Food Insecurity:   . Worried About Charity fundraiser in the Last Year: Not on file  . Ran Out of Food in the Last Year: Not on file  Transportation Needs:   . Lack of Transportation (Medical): Not on file  . Lack of Transportation (Non-Medical): Not on file  Physical Activity:   . Days of Exercise per Week: Not on file  . Minutes of Exercise per Session: Not on file  Stress:   . Feeling of Stress : Not on file  Social Connections:   . Frequency of Communication with Friends and Family: Not on file  . Frequency of Social Gatherings with Friends and Family: Not on file  . Attends Religious Services: Not on file  . Active Member of Clubs or Organizations: Not on file  . Attends Archivist Meetings: Not on file  . Marital Status: Not on file    Allergies:  Allergies  Allergen Reactions  . Keflex [Cephalexin] Itching and Rash    Metabolic Disorder Labs: No results found for: HGBA1C, MPG No results found for: PROLACTIN Lab Results  Component Value Date   CHOL 147 02/07/2020   TRIG 65 02/07/2020   HDL 69 02/07/2020   CHOLHDL 2.1 02/07/2020   LDLCALC 65 02/07/2020   No results found for: TSH  Therapeutic Level Labs: No results found for: LITHIUM No results found for: VALPROATE No components found for:  CBMZ  Current Medications: Current Outpatient Medications  Medication Sig Dispense Refill  . albuterol (VENTOLIN HFA) 108 (90 Base) MCG/ACT inhaler Inhale 2 puffs into the lungs every 6 (six) hours as needed for wheezing or shortness of breath. (Patient not taking: Reported on 06/26/2020)    . ALOE PO Take by mouth. (Patient not taking: Reported on 06/26/2020)    . ALPRAZolam (XANAX) 0.5 MG tablet Take 1 tablet (0.5 mg total) by mouth 2 (two) times daily as needed for anxiety. 60 tablet 1  . aspirin-sod bicarb-citric acid (ALKA-SELTZER) 325 MG TBEF tablet Take 325 mg by mouth every 6 (six) hours as needed (acid relief).    . bifidobacterium infantis (ALIGN)  capsule Take 1 capsule by mouth daily.    Marland Kitchen buPROPion (WELLBUTRIN XL) 300 MG 24 hr tablet TAKE 1 TABLET BY MOUTH EVERY DAY 90 tablet 1  . CALCIUM PO Take 1 tablet by mouth daily.    . Cholecalciferol (VITAMIN D3) 1.25 MG (50000 UT) CAPS Take 5,000 Units by mouth daily.     . ciprofloxacin (CIPRO) 500 MG tablet Take 500 mg by mouth 2 (two) times daily. (Patient not taking: Reported on 06/26/2020)    . estradiol (ESTRACE) 0.5 MG tablet Take 0.5 mg by mouth daily. (Patient not taking: Reported on 06/26/2020)    . ezetimibe (ZETIA) 10 MG tablet Take 1 tablet (10  mg total) by mouth daily. 90 tablet 3  . gabapentin (NEURONTIN) 600 MG tablet Take 600 mg by mouth 3 (three) times daily. (Patient not taking: Reported on 06/26/2020)    . levothyroxine (SYNTHROID) 75 MCG tablet Take 75 mcg by mouth daily before breakfast.    . losartan-hydrochlorothiazide (HYZAAR) 100-25 MG tablet Take 1 tablet by mouth daily.    Marland Kitchen omeprazole (PRILOSEC) 40 MG capsule Take 40 mg by mouth daily.    . ondansetron (ZOFRAN) 8 MG tablet Take 8 mg by mouth 2 (two) times daily as needed for nausea/vomiting.    Marland Kitchen OVER THE COUNTER MEDICATION Take 1 tablet by mouth daily. [employer] Aloe    . progesterone (PROMETRIUM) 100 MG capsule Take 100 mg by mouth at bedtime.    Marland Kitchen rOPINIRole (REQUIP) 3 MG tablet Take 3 mg by mouth at bedtime.    . rosuvastatin (CRESTOR) 5 MG tablet Take 5 mg by mouth daily.    . Simethicone 125 MG CAPS Take 125 mg by mouth 3 (three) times daily as needed (flatulence).    Marland Kitchen sulfamethoxazole-trimethoprim (BACTRIM DS) 800-160 MG tablet Take 1 tablet by mouth 2 (two) times daily.    . Vilazodone HCl (VIIBRYD) 10 MG TABS Take 1 tablet (10 mg total) by mouth daily for 7 days, THEN 2 tablets (20 mg total) daily. Take it with food!. 67 tablet 0  . [START ON 07/28/2020] zolpidem (AMBIEN CR) 12.5 MG CR tablet Take 1 tablet (12.5 mg total) by mouth at bedtime as needed. for sleep 30 tablet 1   No current facility-administered  medications for this visit.      Psychiatric Specialty Exam: Review of Systems  Gastrointestinal: Positive for nausea.  Neurological: Positive for headaches.  Psychiatric/Behavioral: The patient is nervous/anxious.   All other systems reviewed and are negative.   Last menstrual period 12/31/2012.There is no height or weight on file to calculate BMI.  General Appearance: Casual and Well Groomed  Eye Contact:  Good  Speech:  Clear and Coherent and Normal Rate  Volume:  Normal  Mood:  Anxious  Affect:  Full Range  Thought Process:  Goal Directed and Linear  Orientation:  Full (Time, Place, and Person)  Thought Content: Rumination   Suicidal Thoughts:  No  Homicidal Thoughts:  No  Memory:  Immediate;   Good Recent;   Good Remote;   Good  Judgement:  Good  Insight:  Fair  Psychomotor Activity:  Normal  Concentration:  Concentration: Good  Recall:  Good  Fund of Knowledge: Good  Language: Good  Akathisia:  Possibly.  Handed:  Right  AIMS (if indicated): not done  Assets:  Communication Skills Desire for Improvement Financial Resources/Insurance Housing Social Support  ADL's:  Intact  Cognition: WNL  Sleep:  Good   Screenings: PHQ2-9     Counselor from 03/28/2020 in [facility]  PHQ-2 Total Score 4  PHQ-9 Total Score 15       Assessment and Plan: 64 y.o.marriedfemalewith a hx of depression and anxiety. She has a hx of two IP admissions and recent (June 2021) OD on zolpidem She has tried imipramine then sertraline and duloxetine but could not tolerate sexual side. Now on escitalopram 20 mg - she tolerated 10 mg well but it did not help with anxiety. After dose was increased to 20 mg she started to experience headaches, loss of libido and nausea. Anxiety did not improve either. She worries "about everything", but energy and focusing ability has improved after  addition of Wellbutrin. She has been feeling less depressed and denies having SI. We have  changed zolpidem IR to CR and she sleeps better (still has to take 0.5 mg of alprazolam at night to be able to fall asleep).    Dx: MDD recurrent mild; GAD  Plan: We will taper escitalopram off over next week and then start vilazodone 10 mg x 1 week, then increase to 20 mg daily with food. While it is less likely to cause sexual sides effects and headaches than SSRIs/SNRIs it may trigger nausea. We will continue bupropion XL to 300 mg in am to help with fatigue, concentration, zolpidem CR and alprazolam unchanged. [name] will continue in individual counseling with Boyd Kerbs on bi-weekly basis. Next appointment with me in 6 weeks.The plan was discussed with patient who had an opportunity to ask questions and these were all answered. I spend 15 minutes invideoclinical contact with the patient.    Stephanie Acre, MD 07/10/2020, 9:46 AM

## 2020-07-12 ENCOUNTER — Encounter: Payer: Self-pay | Admitting: Licensed Clinical Social Worker

## 2020-07-12 ENCOUNTER — Other Ambulatory Visit: Payer: Self-pay

## 2020-07-12 ENCOUNTER — Ambulatory Visit (INDEPENDENT_AMBULATORY_CARE_PROVIDER_SITE_OTHER): Payer: Medicare Other | Admitting: Licensed Clinical Social Worker

## 2020-07-12 DIAGNOSIS — F411 Generalized anxiety disorder: Secondary | ICD-10-CM | POA: Diagnosis not present

## 2020-07-12 DIAGNOSIS — F330 Major depressive disorder, recurrent, mild: Secondary | ICD-10-CM

## 2020-07-12 NOTE — Progress Notes (Signed)
Patient Location: Home  Provider Location: Home Office   Virtual Visit via Video Note  I connected with [name] on 07/12/20 at  9:00 AM EDT by a video enabled telemedicine application and verified that I am speaking with the correct person using two identifiers.   I discussed the limitations of evaluation and management by telemedicine and the availability of in person appointments. The patient expressed understanding and agreed to proceed.  THERAPY PROGRESS NOTE  Session Time: 29 Minutes  Participation Level: Active  Behavioral Response: Well GroomedAlertAnxious and Depressed  Type of Therapy: Individual Therapy  Treatment Goals addressed: Anxiety and Coping  Interventions: CBT  Summary: Cheryl Burgess is a 64 y.o. female who presents with depression and anxiety sxs. Pt reported changes to her medication due to side effect on Lexapro of nausea. Pt reported she continues to stay home most of the day and fears leaving the house at the chance she might run into people who will ask about what happened and is not sure what to say. Pt did report however that she and some family members have made plans next weekend to go to the beach for a few days and is looking forward to it. Pt identified self-defeating thoughts and feelings of continued guilt and shame. Pt reported the journal writing helped reduce some of the anger, but has not kept up with it. Pt reported she is having a hard time keeping focused and remembering things she was supposed to do. Pt engaged in mindfulness exercise..   Suicidal/Homicidal: No  Therapist Response: Therapist met with patient for follow up session. Therapist and patient reviewed homework assignment. Therapist and patient revisited discussion around coping in social situations and engaging in pleasant activities. Therapist introduced concept of mindfulness skills and engaged patient in self-soothing with the 5 senses. Pt was receptive. Therapist  encouraged patient to make small goals for self and continue to practice mindfulness skills as well as logic games on her phone.  Plan: Return again in 1 month.  Diagnosis: Axis I: Generalized Anxiety Disorder and MDD, Recurrent, Mild    Axis II: N/A  Josephine Igo, LCSW, LCAS 07/12/2020

## 2020-07-13 ENCOUNTER — Ambulatory Visit: Admit: 2020-07-13 | Payer: Medicare Other | Admitting: Surgery

## 2020-07-13 SURGERY — CHOLECYSTECTOMY, ROBOT-ASSISTED, LAPAROSCOPIC
Anesthesia: General

## 2020-07-14 ENCOUNTER — Other Ambulatory Visit (HOSPITAL_COMMUNITY): Payer: Self-pay | Admitting: Psychiatry

## 2020-07-15 ENCOUNTER — Other Ambulatory Visit (HOSPITAL_COMMUNITY): Payer: Self-pay | Admitting: Psychiatry

## 2020-07-26 ENCOUNTER — Other Ambulatory Visit: Payer: Self-pay | Admitting: Gastroenterology

## 2020-07-26 DIAGNOSIS — R1011 Right upper quadrant pain: Secondary | ICD-10-CM

## 2020-07-27 ENCOUNTER — Other Ambulatory Visit (HOSPITAL_COMMUNITY): Payer: Self-pay | Admitting: Psychiatry

## 2020-07-31 ENCOUNTER — Telehealth (HOSPITAL_COMMUNITY): Payer: Self-pay | Admitting: *Deleted

## 2020-07-31 NOTE — Telephone Encounter (Signed)
Pt called today stating that she absolutely cannot tolerate the ViiBryd. She has an upcoming appointment on 08/20/20. Please review.

## 2020-07-31 NOTE — Telephone Encounter (Signed)
Then she should go down to 10 mg (if she started to take two for a total of 20 mg) for 3-4 days then stop. She could not tolerate several other antidepressants so perhaps we should do  a GeneSight test before trying yet another one (if [facility] pays for that)?

## 2020-08-06 ENCOUNTER — Ambulatory Visit
Admission: RE | Admit: 2020-08-06 | Discharge: 2020-08-06 | Disposition: A | Discharge status: Home / Self Care | Payer: Medicare Other | Source: Ambulatory Visit | Attending: Gastroenterology | Admitting: Gastroenterology

## 2020-08-06 DIAGNOSIS — R1011 Right upper quadrant pain: Secondary | ICD-10-CM

## 2020-08-07 ENCOUNTER — Other Ambulatory Visit: Payer: Self-pay

## 2020-08-07 ENCOUNTER — Telehealth (INDEPENDENT_AMBULATORY_CARE_PROVIDER_SITE_OTHER): Payer: Medicare Other | Admitting: Psychiatry

## 2020-08-07 ENCOUNTER — Telehealth (HOSPITAL_COMMUNITY): Payer: Self-pay | Admitting: *Deleted

## 2020-08-07 ENCOUNTER — Telehealth (HOSPITAL_COMMUNITY): Payer: Self-pay | Admitting: Psychiatry

## 2020-08-07 DIAGNOSIS — F332 Major depressive disorder, recurrent severe without psychotic features: Secondary | ICD-10-CM | POA: Diagnosis not present

## 2020-08-07 DIAGNOSIS — F411 Generalized anxiety disorder: Secondary | ICD-10-CM

## 2020-08-07 MED ORDER — NORTRIPTYLINE HCL 25 MG PO CAPS
ORAL_CAPSULE | ORAL | 0 refills | Status: DC
Start: 2020-08-07 — End: 2020-08-22

## 2020-08-07 MED ORDER — ALPRAZOLAM 0.5 MG PO TABS: 0.5000 mg | ORAL_TABLET | Freq: Two times a day (BID) | ORAL | 1 refills | Status: DC | PRN

## 2020-08-07 NOTE — Telephone Encounter (Signed)
She has appointment today.

## 2020-08-07 NOTE — Telephone Encounter (Signed)
Pt called c/o SI without a plan. Says she's feeling more anxious, irritable, and depressed. Pt asked about restarting Imipramine as she took it in the 80's and it worked. Pt is scheduled to see you on 11/16 however writer is in process of getting her an earlier appointment. Pt verbally contracts for safety ( states that she "doesn't have the means ) and says she is not alone.

## 2020-08-07 NOTE — Progress Notes (Signed)
BH MD/PA/NP OP Progress Note  08/07/2020 4:15 PM Cheryl Burgess  MRN:  [id number] Interview was conducted by phone and I verified that I was speaking with the correct person using two identifiers. I discussed the limitations of evaluation and management by telemedicine and  the availability of in person appointments. Patient expressed understanding and agreed to proceed. Patient location - home; physician - home office.  Chief Complaint: Depression.  HPI: 64y.o.marriedfemalewith a hx of depressionand anxiety. She has a hx of two IP admissions and recent (June 2021) OD on zolpidem She has tried imipramine then sertraline and duloxetine but could not tolerate sexual side. Now on escitalopram 20 mg - she tolerated 10 mg well but it did not help with anxiety. After dose was increased to 20 mg she started to experience headaches, loss of libido and nausea. Anxiety did not improve either.She worries "about everything",but energy and focusing ability has improved after addition of Wellbutrin. She has been feeling less depressed and denies having SI. We have changed zolpidem IR to CR and she sleeps better (still has to take0.5 mg of alprazolam at nightto be able to fall asleep). We have dc escitalopram and started vilazodone but she could not tolerate nausea it caused and stopped. She has reported over past few days that her depression worsened and that she was having passive SI (no plan or intent, contracting for safety). We have done GeneSight testing but results are not available as yet. [name] reports that she had good response to imipramine on which she had been  Since 1985 for several years (up to 300 mg at one time). [name] is not interested in going back to IOP, cancelled appointment with Boyd Kerbs because she "needed to take care of my car's windshield" (in a way a good sign that she can focus/take care of such daily things).   Visit Diagnosis:    ICD-10-CM   1. Severe episode of  recurrent major depressive disorder, without psychotic features ([place])  F33.2   2. GAD (generalized anxiety disorder)  F41.1     Past Psychiatric History: Please see intake H&P.  Past Medical History:  Past Medical History:  Diagnosis Date  . Ambien use disorder, mild, abuse ([place])   . Anemia, unspecified   . Atherosclerosis of aorta ([place])   . Benzodiazepine abuse ([place])   . COPD (chronic obstructive pulmonary disease) ([place])   . Depression   . Hypertension   . Hypothyroidism   . Insomnia   . Menopausal syndrome (hot flashes)   . Recurrent UTI   . RLS (restless legs syndrome)   . Superficial basal cell carcinoma (BCC) 04/19/2013   Post Right Knee (Cx3,5FU)  . Thalassemia minor     Past Surgical History:  Procedure Laterality Date  . calf implants    . COLONOSCOPY    . ENDOSCOPIC RETROGRADE CHOLANGIOPANCREATOGRAPHY (ERCP) WITH PROPOFOL N/A 07/04/2020   Procedure: ENDOSCOPIC RETROGRADE CHOLANGIOPANCREATOGRAPHY (ERCP) WITH PROPOFOL;  Surgeon: Ronnette Juniper, MD;  Location: [facility];  Service: Gastroenterology;  Laterality: N/A;  . REMOVAL OF STONES  07/04/2020   Procedure: REMOVAL OF STONES;  Surgeon: Ronnette Juniper, MD;  Location: [facility];  Service: Gastroenterology;;  . Joan Mayans  07/04/2020   Procedure: SPHINCTEROTOMY;  Surgeon: Ronnette Juniper, MD;  Location: [facility];  Service: Gastroenterology;;    Family Psychiatric History: Reviewed.  Family History:  Family History  Problem Relation Age of Onset  . Osteoporosis Mother   . Macular degeneration Mother   . Cancer Mother  bladder  . Alzheimer's disease Mother   . Dementia Mother   . Parkinson's disease Mother   . CAD Father   . Hypertension Father   . Diabetes Father   . Depression Father   . Kidney disease Brother   . Breast cancer Neg Hx     Social History:  Social History   Socioeconomic History  . Marital status: Married    Spouse name: Not on file  . Number of children: 2  . Years of  education: Not on file  . Highest education level: Not on file  Occupational History  . Not on file  Tobacco Use  . Smoking status: Former Smoker    Packs/day: 2.00    Years: 30.00    Pack years: 60.00    Quit date: 2000    Years since quitting: 21.8  . Smokeless tobacco: Never Used  Vaping Use  . Vaping Use: Never used  Substance and Sexual Activity  . Alcohol use: Never  . Drug use: Never  . Sexual activity: Not on file  Other Topics Concern  . Not on file  Social History Narrative  . Not on file   Social Determinants of Health   Financial Resource Strain:   . Difficulty of Paying Living Expenses: Not on file  Food Insecurity:   . Worried About Charity fundraiser in the Last Year: Not on file  . Ran Out of Food in the Last Year: Not on file  Transportation Needs:   . Lack of Transportation (Medical): Not on file  . Lack of Transportation (Non-Medical): Not on file  Physical Activity:   . Days of Exercise per Week: Not on file  . Minutes of Exercise per Session: Not on file  Stress:   . Feeling of Stress : Not on file  Social Connections:   . Frequency of Communication with Friends and Family: Not on file  . Frequency of Social Gatherings with Friends and Family: Not on file  . Attends Religious Services: Not on file  . Active Member of Clubs or Organizations: Not on file  . Attends Archivist Meetings: Not on file  . Marital Status: Not on file    Allergies:  Allergies  Allergen Reactions  . Keflex [Cephalexin] Itching and Rash    Metabolic Disorder Labs: No results found for: HGBA1C, MPG No results found for: PROLACTIN Lab Results  Component Value Date   CHOL 147 02/07/2020   TRIG 65 02/07/2020   HDL 69 02/07/2020   CHOLHDL 2.1 02/07/2020   LDLCALC 65 02/07/2020   No results found for: TSH  Therapeutic Level Labs: No results found for: LITHIUM No results found for: VALPROATE No components found for:  CBMZ  Current  Medications: Current Outpatient Medications  Medication Sig Dispense Refill  . albuterol (VENTOLIN HFA) 108 (90 Base) MCG/ACT inhaler Inhale 2 puffs into the lungs every 6 (six) hours as needed for wheezing or shortness of breath. (Patient not taking: Reported on 06/26/2020)    . ALOE PO Take by mouth. (Patient not taking: Reported on 06/26/2020)    . ALPRAZolam (XANAX) 0.5 MG tablet Take 1 tablet (0.5 mg total) by mouth 2 (two) times daily as needed for anxiety. 60 tablet 1  . aspirin-sod bicarb-citric acid (ALKA-SELTZER) 325 MG TBEF tablet Take 325 mg by mouth every 6 (six) hours as needed (acid relief).    . bifidobacterium infantis (ALIGN) capsule Take 1 capsule by mouth daily.    Marland Kitchen buPROPion [facility]  XL) 300 MG 24 hr tablet TAKE 1 TABLET BY MOUTH EVERY DAY 90 tablet 1  . CALCIUM PO Take 1 tablet by mouth daily.    . Cholecalciferol (VITAMIN D3) 1.25 MG (50000 UT) CAPS Take 5,000 Units by mouth daily.     . ciprofloxacin (CIPRO) 500 MG tablet Take 500 mg by mouth 2 (two) times daily. (Patient not taking: Reported on 06/26/2020)    . estradiol (ESTRACE) 0.5 MG tablet Take 0.5 mg by mouth daily. (Patient not taking: Reported on 06/26/2020)    . ezetimibe (ZETIA) 10 MG tablet Take 1 tablet (10 mg total) by mouth daily. 90 tablet 3  . gabapentin (NEURONTIN) 600 MG tablet Take 600 mg by mouth 3 (three) times daily. (Patient not taking: Reported on 06/26/2020)    . levothyroxine (SYNTHROID) 75 MCG tablet Take 75 mcg by mouth daily before breakfast.    . losartan-hydrochlorothiazide (HYZAAR) 100-25 MG tablet Take 1 tablet by mouth daily.    . nortriptyline (PAMELOR) 25 MG capsule Take 1 capsule (25 mg total) by mouth at bedtime for 7 days, THEN 2 capsules (50 mg total) at bedtime for 7 days, THEN 3 capsules (75 mg total) at bedtime for 14 days. 63 capsule 0  . omeprazole (PRILOSEC) 40 MG capsule Take 40 mg by mouth daily.    . ondansetron (ZOFRAN) 8 MG tablet Take 8 mg by mouth 2 (two) times daily as  needed for nausea/vomiting.    Marland Kitchen OVER THE COUNTER MEDICATION Take 1 tablet by mouth daily. [place] Aloe    . progesterone (PROMETRIUM) 100 MG capsule Take 100 mg by mouth at bedtime.    Marland Kitchen rOPINIRole (REQUIP) 3 MG tablet Take 3 mg by mouth at bedtime.    . rosuvastatin (CRESTOR) 5 MG tablet Take 5 mg by mouth daily.    . Simethicone 125 MG CAPS Take 125 mg by mouth 3 (three) times daily as needed (flatulence).    Marland Kitchen sulfamethoxazole-trimethoprim (BACTRIM DS) 800-160 MG tablet Take 1 tablet by mouth 2 (two) times daily.    Marland Kitchen zolpidem (AMBIEN CR) 12.5 MG CR tablet Take 1 tablet (12.5 mg total) by mouth at bedtime as needed. for sleep 30 tablet 1   No current facility-administered medications for this visit.      Psychiatric Specialty Exam: Review of Systems  Psychiatric/Behavioral: Positive for sleep disturbance. The patient is nervous/anxious.   All other systems reviewed and are negative.   Last menstrual period 12/31/2012.There is no height or weight on file to calculate BMI.  General Appearance: NA  Eye Contact:  NA  Speech:  Clear and Coherent and Normal Rate  Volume:  Normal  Mood:  Anxious and Depressed  Affect:  NA  Thought Process:  Goal Directed and Linear  Orientation:  Full (Time, Place, and Person)  Thought Content: Rumination   Suicidal Thoughts:  Yes.  without intent/plan  Homicidal Thoughts:  No  Memory:  Immediate;   Good Recent;   Good Remote;   Good  Judgement:  Fair  Insight:  Fair  Psychomotor Activity:  NA  Concentration:  Concentration: Good  Recall:  Good  Fund of Knowledge: Good  Language: Good  Akathisia:  Negative  Handed:  Right  AIMS (if indicated): not done  Assets:  Communication Skills Desire for Improvement Financial Resources/Insurance Housing Social Support  ADL's:  Intact  Cognition: WNL  Sleep:  Fair   Screenings: PHQ2-9     Counselor from 03/28/2020 in [facility]  PHQ-2  Total Score 4  PHQ-9 Total Score 15        Assessment and Plan: 64y.o.marriedfemalewith a hx of depressionand anxiety. She has a hx of two IP admissions and recent (June 2021) OD on zolpidem She has tried imipramine then sertraline and duloxetine but could not tolerate sexual side. Now on escitalopram 20 mg - she tolerated 10 mg well but it did not help with anxiety. After dose was increased to 20 mg she started to experience headaches, loss of libido and nausea. Anxiety did not improve either.She worries "about everything",but energy and focusing ability has improved after addition of Wellbutrin. She has been feeling less depressed and denies having SI. We have changed zolpidem IR to CR and she sleeps better (still has to take0.5 mg of alprazolam at nightto be able to fall asleep). We have dc escitalopram and started vilazodone but she could not tolerate nausea it caused and stopped. She has reported over past few days that her depression worsened and that she was having passive SI (no plan or intent, contracting for safety). We have done GeneSight testing but results are not available as yet. [name] reports that she had good response to imipramine on which she had been  Since 1985 for several years (up to 300 mg at one time). [name] is not interested in going back to IOP, cancelled appointment with Boyd Kerbs because she "needed to take care of my car's windshield" (in a way a good sign that she can focus/take care of such daily things).  Dx: MDD recurrent moderate to severe; GAD  Plan:We will try nortriptyline for depression/anxiety: 25 mg at HS for a week, then 50 mg for a week then 75 mg. We will continuebupropion XLto 300mg  in am to help with fatigue, concentration, zolpidem CR and alprazolam unchanged.Next appointment with me in 4 weeks.The plan was discussed with patient who had an opportunity to ask questions and these were all answered. I spend20 minutes inphone clinical contact with the patient.   Stephanie Acre, MD 08/07/2020, 4:15 PM

## 2020-08-07 NOTE — Telephone Encounter (Signed)
D:  Pt sent case manager an email stating that she's not doing any better since she completed MH-IOP.  Patient is requesting medication changes. Placed call to patient.  Pt voiced SI, no plan or intent.  "I'm just tired." States she's been crying all the time.  Discussed safety options at length with pt.  Inquired if pt would like to go into the hospital at [facility] [place].  Pt declined.  Pt is able to verbally contract for safety.  Pt agreed to call 911 or go to the nearest ED or [facility] if needed. A:  Provided pt with support.  Also, offered MH-IOP; pt declined at this time.  Pt is also declining individual therapy at this time.  Case manager strongly encouraged pt to keep her appointment with Boyd Kerbs, LCSW this week.  Transferred pt to Tammy Sours, LPN (in Dr. Audria Nine office).  R:  Pt receptive.

## 2020-08-08 ENCOUNTER — Other Ambulatory Visit: Payer: Self-pay | Admitting: Obstetrics & Gynecology

## 2020-08-08 DIAGNOSIS — Z1231 Encounter for screening mammogram for malignant neoplasm of breast: Secondary | ICD-10-CM

## 2020-08-09 ENCOUNTER — Ambulatory Visit: Payer: Medicare Other | Admitting: Licensed Clinical Social Worker

## 2020-08-16 ENCOUNTER — Telehealth (HOSPITAL_COMMUNITY): Payer: Self-pay | Admitting: *Deleted

## 2020-08-16 NOTE — Telephone Encounter (Signed)
Received e-mail as you may know, from pt regarding the Pamelor. Pt states it is not working and she is beginning to wean herself off of it. Pt wants to know if you will try another medication. She mentioned Imipramine again. Please review and advise.

## 2020-08-17 ENCOUNTER — Telehealth (HOSPITAL_COMMUNITY): Payer: Self-pay | Admitting: *Deleted

## 2020-08-17 NOTE — Telephone Encounter (Signed)
Received t/c from pt regarding the Pamelor side effects and that it is "not working". Pt says that she is expierencing body aches and headaches related to medication. So she is discontinuing the medication.

## 2020-08-20 ENCOUNTER — Telehealth (HOSPITAL_COMMUNITY): Payer: Medicare Other | Admitting: Psychiatry

## 2020-08-20 NOTE — Telephone Encounter (Signed)
Do you want to wait for Genesight results?

## 2020-08-20 NOTE — Telephone Encounter (Signed)
Yes - no rush with starting TCA. Please let me know when the result comes back.

## 2020-08-20 NOTE — Telephone Encounter (Signed)
She told me that she had done well on amitriptyline in the past and, interestingly, did not have similar side effects while on dose as high as 300 mg. Perhaps she would like to try amitriptyline instead of nortriptyline again?

## 2020-08-22 ENCOUNTER — Other Ambulatory Visit (HOSPITAL_COMMUNITY): Payer: Self-pay | Admitting: Psychiatry

## 2020-08-22 MED ORDER — IMIPRAMINE PAMOATE 75 MG PO CAPS
75.0000 mg | ORAL_CAPSULE | Freq: Every day | ORAL | 0 refills | Status: DC
Start: 2020-08-22 — End: 2020-09-04

## 2020-08-28 DIAGNOSIS — R350 Frequency of micturition: Secondary | ICD-10-CM | POA: Diagnosis not present

## 2020-09-04 ENCOUNTER — Other Ambulatory Visit: Payer: Self-pay

## 2020-09-04 ENCOUNTER — Telehealth (INDEPENDENT_AMBULATORY_CARE_PROVIDER_SITE_OTHER): Payer: Medicare Other | Admitting: Psychiatry

## 2020-09-04 DIAGNOSIS — F331 Major depressive disorder, recurrent, moderate: Secondary | ICD-10-CM

## 2020-09-04 DIAGNOSIS — F411 Generalized anxiety disorder: Secondary | ICD-10-CM

## 2020-09-04 MED ORDER — ZOLPIDEM TARTRATE ER 12.5 MG PO TBCR: 12.5000 mg | EXTENDED_RELEASE_TABLET | Freq: Every evening | ORAL | 1 refills | Status: DC | PRN

## 2020-09-04 MED ORDER — IMIPRAMINE PAMOATE 75 MG PO CAPS: 75.0000 mg | ORAL_CAPSULE | Freq: Every day | ORAL | 0 refills | Status: DC

## 2020-09-04 MED ORDER — ALPRAZOLAM 0.5 MG PO TABS: 0.5000 mg | ORAL_TABLET | Freq: Two times a day (BID) | ORAL | 1 refills | Status: DC | PRN

## 2020-09-04 NOTE — Progress Notes (Signed)
BH MD/PA/NP OP Progress Note  09/04/2020 4:22 PM Cheryl Burgess  MRN:  [id number] Interview was conducted by phone and I verified that I was speaking with the correct person using two identifiers. I discussed the limitations of evaluation and management by telemedicine and  the availability of in person appointments. Patient expressed understanding and agreed to proceed. Participants in the visit: patient (location - home); physician (location - home office).  Chief Complaint: "I am less depressed but I have headaches and sexual side effects".  HPI: 64y.o.marriedfemalewith a hx of depressionand anxiety. She has a hx of two IP admissions and recent (June 2021) OD on zolpidem She has tried imipramine then sertraline and duloxetine but could not tolerate sexual side. Now on escitalopram20 mg -shetolerated 10 mg well but it did not help with anxiety. After dose was increased to 20 mg she started to experience headaches, loss of libido and nausea. Anxietydid not improveeither.She worries "about everything",but energy and focusing ability has improved after addition of Wellbutrin. Shehas been feeling lessdepressed and denies having SI. We have changed zolpidem IR to CR and she sleeps better (still has to take0.5 mg of alprazolam at nightto be able to fall asleep). We have dc escitalopram and started vilazodone but she could not tolerate nausea it caused and stopped. She has reported over past few days that her depression worsened and that she was having passive SI (no plan or intent, contracting for safety). We have done GeneSight testing but results are not available as yet. Cheryl Burgess reports that she had good response to imipramine on which she had been  Since 1985 for several years (up to 300 mg at one time). Cheryl Burgess is not interested in going back to IOP, cancelled appointment with Boyd Kerbs. We have tried nortriptyline but she could not tolerate adverse effects (headaches and body aches).  Now she has been on 75 mg of imipramine for 2 weeks and mood started to improve although she  Again has headaches as well as sexual side effects.      Visit Diagnosis:    ICD-10-CM   1. Major depressive disorder, recurrent episode, moderate (HCC)  F33.1   2. GAD (generalized anxiety disorder)  F41.1     Past Psychiatric History: Please see intake H&P.  Past Medical History:  Past Medical History:  Diagnosis Date  . Ambien use disorder, mild, abuse ([place])   . Anemia, unspecified   . Atherosclerosis of aorta ([place])   . Benzodiazepine abuse ([place])   . COPD (chronic obstructive pulmonary disease) ([place])   . Depression   . Hypertension   . Hypothyroidism   . Insomnia   . Menopausal syndrome (hot flashes)   . Recurrent UTI   . RLS (restless legs syndrome)   . Superficial basal cell carcinoma (BCC) 04/19/2013   Post Right Knee (Cx3,5FU)  . Thalassemia minor     Past Surgical History:  Procedure Laterality Date  . calf implants    . COLONOSCOPY    . ENDOSCOPIC RETROGRADE CHOLANGIOPANCREATOGRAPHY (ERCP) WITH PROPOFOL N/A 07/04/2020   Procedure: ENDOSCOPIC RETROGRADE CHOLANGIOPANCREATOGRAPHY (ERCP) WITH PROPOFOL;  Surgeon: Ronnette Juniper, MD;  Location: [facility];  Service: Gastroenterology;  Laterality: N/A;  . REMOVAL OF STONES  07/04/2020   Procedure: REMOVAL OF STONES;  Surgeon: Ronnette Juniper, MD;  Location: [facility];  Service: Gastroenterology;;  . Joan Mayans  07/04/2020   Procedure: SPHINCTEROTOMY;  Surgeon: Ronnette Juniper, MD;  Location: [facility];  Service: Gastroenterology;;    Family Psychiatric History: Reviewed.  Family History:  Family History  Problem Relation Age of Onset  . Osteoporosis Mother   . Macular degeneration Mother   . Cancer Mother        bladder  . Alzheimer's disease Mother   . Dementia Mother   . Parkinson's disease Mother   . CAD Father   . Hypertension Father   . Diabetes Father   . Depression Father   . Kidney disease Brother   .  Breast cancer Neg Hx     Social History:  Social History   Socioeconomic History  . Marital status: Married    Spouse name: Not on file  . Number of children: 2  . Years of education: Not on file  . Highest education level: Not on file  Occupational History  . Not on file  Tobacco Use  . Smoking status: Former Smoker    Packs/day: 2.00    Years: 30.00    Pack years: 60.00    Quit date: 2000    Years since quitting: 21.9  . Smokeless tobacco: Never Used  Vaping Use  . Vaping Use: Never used  Substance and Sexual Activity  . Alcohol use: Never  . Drug use: Never  . Sexual activity: Not on file  Other Topics Concern  . Not on file  Social History Narrative  . Not on file   Social Determinants of Health   Financial Resource Strain:   . Difficulty of Paying Living Expenses: Not on file  Food Insecurity:   . Worried About Charity fundraiser in the Last Year: Not on file  . Ran Out of Food in the Last Year: Not on file  Transportation Needs:   . Lack of Transportation (Medical): Not on file  . Lack of Transportation (Non-Medical): Not on file  Physical Activity:   . Days of Exercise per Week: Not on file  . Minutes of Exercise per Session: Not on file  Stress:   . Feeling of Stress : Not on file  Social Connections:   . Frequency of Communication with Friends and Family: Not on file  . Frequency of Social Gatherings with Friends and Family: Not on file  . Attends Religious Services: Not on file  . Active Member of Clubs or Organizations: Not on file  . Attends Archivist Meetings: Not on file  . Marital Status: Not on file    Allergies:  Allergies  Allergen Reactions  . Keflex [Cephalexin] Itching and Rash    Metabolic Disorder Labs: No results found for: HGBA1C, MPG No results found for: PROLACTIN Lab Results  Component Value Date   CHOL 147 02/07/2020   TRIG 65 02/07/2020   HDL 69 02/07/2020   CHOLHDL 2.1 02/07/2020   LDLCALC 65 02/07/2020    No results found for: TSH  Therapeutic Level Labs: No results found for: LITHIUM No results found for: VALPROATE No components found for:  CBMZ  Current Medications: Current Outpatient Medications  Medication Sig Dispense Refill  . albuterol (VENTOLIN HFA) 108 (90 Base) MCG/ACT inhaler Inhale 2 puffs into the lungs every 6 (six) hours as needed for wheezing or shortness of breath. (Patient not taking: Reported on 06/26/2020)    . ALOE PO Take by mouth. (Patient not taking: Reported on 06/26/2020)    . [START ON 10/07/2020] ALPRAZolam (XANAX) 0.5 MG tablet Take 1 tablet (0.5 mg total) by mouth 2 (two) times daily as needed for anxiety. 60 tablet 1  . aspirin-sod bicarb-citric acid (ALKA-SELTZER) 325 MG TBEF tablet  Take 325 mg by mouth every 6 (six) hours as needed (acid relief).    . bifidobacterium infantis (ALIGN) capsule Take 1 capsule by mouth daily.    Marland Kitchen buPROPion (WELLBUTRIN XL) 300 MG 24 hr tablet TAKE 1 TABLET BY MOUTH EVERY DAY 90 tablet 1  . CALCIUM PO Take 1 tablet by mouth daily.    . Cholecalciferol (VITAMIN D3) 1.25 MG (50000 UT) CAPS Take 5,000 Units by mouth daily.     . ciprofloxacin (CIPRO) 500 MG tablet Take 500 mg by mouth 2 (two) times daily. (Patient not taking: Reported on 06/26/2020)    . estradiol (ESTRACE) 0.5 MG tablet Take 0.5 mg by mouth daily. (Patient not taking: Reported on 06/26/2020)    . ezetimibe (ZETIA) 10 MG tablet Take 1 tablet (10 mg total) by mouth daily. 90 tablet 3  . gabapentin (NEURONTIN) 600 MG tablet Take 600 mg by mouth 3 (three) times daily. (Patient not taking: Reported on 06/26/2020)    . [START ON 09/21/2020] imipramine (TOFRANIL-PM) 75 MG capsule Take 1 capsule (75 mg total) by mouth at bedtime. 30 capsule 0  . levothyroxine (SYNTHROID) 75 MCG tablet Take 75 mcg by mouth daily before breakfast.    . losartan-hydrochlorothiazide (HYZAAR) 100-25 MG tablet Take 1 tablet by mouth daily.    Marland Kitchen omeprazole (PRILOSEC) 40 MG capsule Take 40 mg by mouth  daily.    . ondansetron (ZOFRAN) 8 MG tablet Take 8 mg by mouth 2 (two) times daily as needed for nausea/vomiting.    Marland Kitchen OVER THE COUNTER MEDICATION Take 1 tablet by mouth daily. [place] Aloe    . progesterone (PROMETRIUM) 100 MG capsule Take 100 mg by mouth at bedtime.    Marland Kitchen rOPINIRole (REQUIP) 3 MG tablet Take 3 mg by mouth at bedtime.    . rosuvastatin (CRESTOR) 5 MG tablet Take 5 mg by mouth daily.    . Simethicone 125 MG CAPS Take 125 mg by mouth 3 (three) times daily as needed (flatulence).    Marland Kitchen sulfamethoxazole-trimethoprim (BACTRIM DS) 800-160 MG tablet Take 1 tablet by mouth 2 (two) times daily.    Derrill Memo ON 09/26/2020] zolpidem (AMBIEN CR) 12.5 MG CR tablet Take 1 tablet (12.5 mg total) by mouth at bedtime as needed. for sleep 30 tablet 1   No current facility-administered medications for this visit.       Psychiatric Specialty Exam: Review of Systems  Neurological: Positive for headaches.  All other systems reviewed and are negative.   Last menstrual period 12/31/2012.There is no height or weight on file to calculate BMI.  General Appearance: NA  Eye Contact:  NA  Speech:  Clear and Coherent and Normal Rate  Volume:  Normal  Mood:  Anxious and Depressed  Affect:  NA  Thought Process:  Goal Directed  Orientation:  Full (Time, Place, and Person)  Thought Content: Logical   Suicidal Thoughts:  No  Homicidal Thoughts:  No  Memory:  Immediate;   Good Recent;   Good Remote;   Good  Judgement:  Good  Insight:  Fair  Psychomotor Activity:  NA  Concentration:  Concentration: Good  Recall:  Good  Fund of Knowledge: Good  Language: Good  Akathisia:  Negative  Handed:  Right  AIMS (if indicated): not done  Assets:  Communication Skills Desire for Improvement Financial Resources/Insurance Housing Social Support  ADL's:  Intact  Cognition: WNL  Sleep:  Good   Screenings: PHQ2-9     Counselor from 03/28/2020 in [place]  INTENSIVE PSYCH  PHQ-2 Total Score 4   PHQ-9 Total Score 15       Assessment and Plan: 64y.o.marriedfemalewith a hx of depressionand anxiety. She has a hx of two IP admissions and recent (June 2021) OD on zolpidem She has tried imipramine then sertraline and duloxetine but could not tolerate sexual side. Now on escitalopram20 mg -shetolerated 10 mg well but it did not help with anxiety. After dose was increased to 20 mg she started to experience headaches, loss of libido and nausea. Anxietydid not improveeither.She worries "about everything",but energy and focusing ability has improved after addition of Wellbutrin. Shehas been feeling lessdepressed and denies having SI. We have changed zolpidem IR to CR and she sleeps better (still has to take0.5 mg of alprazolam at nightto be able to fall asleep). We have dc escitalopram and started vilazodone but she could not tolerate nausea it caused and stopped. She has reported over past few days that her depression worsened and that she was having passive SI (no plan or intent, contracting for safety). We have done GeneSight testing but results are not available as yet. Cheryl Burgess reports that she had good response to imipramine on which she had been  Since 1985 for several years (up to 300 mg at one time). Cheryl Burgess is not interested in going back to IOP, cancelled appointment with Boyd Kerbs. We have tried nortriptyline but she could not tolerate adverse effects (headaches and body aches). Now she has been on 75 mg of imipramine for 2 weeks and mood started to improve although she  Again has headaches as well as sexual side effects.   Dx: MDD recurrent moderate; GAD  Plan:We will continue 75 mg of imipramine at HS for now - should side effects subside we may try to increase dose by 25 mg every few weeks towards target dose of 150-200 mg. We will continuebupropion XLto 300mg  in am to help with fatigue, concentration,zolpidem CR and alprazolam unchanged.Next appointment with me  in6 weeks.The plan was discussed with patient who had an opportunity to ask questions and these were all answered. I spend15 minutes inphone clinical contact with the patient.   Stephanie Acre, MD 09/04/2020, 4:22 PM

## 2020-09-25 ENCOUNTER — Other Ambulatory Visit (HOSPITAL_COMMUNITY): Payer: Self-pay | Admitting: Psychiatry

## 2020-10-03 DIAGNOSIS — N3020 Other chronic cystitis without hematuria: Secondary | ICD-10-CM | POA: Diagnosis not present

## 2020-10-12 ENCOUNTER — Other Ambulatory Visit (HOSPITAL_COMMUNITY): Payer: Self-pay | Admitting: Psychiatry

## 2020-10-15 ENCOUNTER — Other Ambulatory Visit: Payer: Self-pay

## 2020-10-15 ENCOUNTER — Telehealth (INDEPENDENT_AMBULATORY_CARE_PROVIDER_SITE_OTHER): Payer: Medicare Other | Admitting: Psychiatry

## 2020-10-15 DIAGNOSIS — F331 Major depressive disorder, recurrent, moderate: Secondary | ICD-10-CM | POA: Diagnosis not present

## 2020-10-15 DIAGNOSIS — F411 Generalized anxiety disorder: Secondary | ICD-10-CM

## 2020-10-15 MED ORDER — IMIPRAMINE HCL 50 MG PO TABS: ORAL_TABLET | ORAL | 0 refills | Status: DC

## 2020-10-15 NOTE — Progress Notes (Signed)
BH MD/PA/NP OP Progress Note  10/15/2020 3:17 PM Cheryl Burgess  MRN:  [id number] Interview was conducted by phone as patient was unable to connect through videoconferencing application and I verified that I was speaking with the correct person using two identifiers. I discussed the limitations of evaluation and management by telemedicine and  the availability of in person appointments. Patient expressed understanding and agreed to proceed. Participants in the visit: patient (location - home); physician (location - home office).  Chief Complaint: Some depression/anxiety.  HPI: 65y.o.marriedfemalewith a hx of depressionand anxiety. She has a hx of two IP admissions and recent (June 2021) OD on zolpidem She has tried imipramine then sertraline and duloxetine but could not tolerate sexual side. Now on escitalopram20 mg -shetolerated 10 mg well but it did not help with anxiety. After dose was increased to 20 mg she started to experience headaches, loss of libido and nausea. Anxietydid not improveeither.She worries "about everything",but energy and focusing ability has improved after addition of Wellbutrin. Shehas been feeling lessdepressed and denies having SI. We have changed zolpidem IR to CR and she sleeps better (still has to take0.5 mg of alprazolam at nightto be able to fall asleep).We have dc escitalopram and started vilazodone but she could not tolerate nausea it caused and stopped.She has reported over past few days that her depression worsened and that she was having passive SI (no plan or intent, contracting for safety). We have done GeneSight testing but results are not available as yet. Cheryl Burgess reports that she had good response to imipramine on which she had been Since 1985 for several years (up to 300 mg at one time). Cheryl Burgess is not interested in going back to IOP, cancelled appointment with Cheryl Burgess. We have tried nortriptyline but she could not tolerate adverse effects  (headaches and body aches). We changed it to imipramine and her mood improved on 75 mg but again she developed headaches and stopped taking it about a week ago. She claims not having side effects when she was on tablet forms (50 mg) of imipramine in the past?     Visit Diagnosis:    ICD-10-CM   1. GAD (generalized anxiety disorder)  F41.1   2. Major depressive disorder, recurrent episode, moderate (HCC)  F33.1     Past Psychiatric History: Please see intake H&P.  Past Medical History:  Past Medical History:  Diagnosis Date  . Ambien use disorder, mild, abuse ([place])   . Anemia, unspecified   . Atherosclerosis of aorta ([place])   . Benzodiazepine abuse ([place])   . COPD (chronic obstructive pulmonary disease) ([place])   . Depression   . Hypertension   . Hypothyroidism   . Insomnia   . Menopausal syndrome (hot flashes)   . Recurrent UTI   . RLS (restless legs syndrome)   . Superficial basal cell carcinoma (BCC) 04/19/2013   Post Right Knee (Cx3,5FU)  . Thalassemia minor     Past Surgical History:  Procedure Laterality Date  . calf implants    . COLONOSCOPY    . ENDOSCOPIC RETROGRADE CHOLANGIOPANCREATOGRAPHY (ERCP) WITH PROPOFOL N/A 07/04/2020   Procedure: ENDOSCOPIC RETROGRADE CHOLANGIOPANCREATOGRAPHY (ERCP) WITH PROPOFOL;  Surgeon: Ronnette Juniper, MD;  [facility];  Service: Gastroenterology;  Laterality: N/A;  . REMOVAL OF STONES  07/04/2020   Procedure: REMOVAL OF STONES;  Surgeon: Ronnette Juniper, MD;  [facility]: [facility];  Service: Gastroenterology;;  . Cheryl Burgess  07/04/2020   Procedure: Cheryl Burgess;  Surgeon: Ronnette Juniper, MD;  [facility];  Service: Gastroenterology;;  Family Psychiatric History: Reviewed.  Family History:  Family History  Problem Relation Age of Onset  . Osteoporosis Mother   . Macular degeneration Mother   . Cancer Mother        bladder  . Alzheimer's disease Mother   . Dementia Mother   . Parkinson's disease Mother   . CAD  Father   . Hypertension Father   . Diabetes Father   . Depression Father   . Kidney disease Brother   . Breast cancer Neg Hx     Social History:  Social History   Socioeconomic History  . Marital status: Married    Spouse name: Not on file  . Number of children: 2  . Years of education: Not on file  . Highest education level: Not on file  Occupational History  . Not on file  Tobacco Use  . Smoking status: Former Smoker    Packs/day: 2.00    Years: 30.00    Pack years: 60.00    Quit date: 2000    Years since quitting: 22.0  . Smokeless tobacco: Never Used  Vaping Use  . Vaping Use: Never used  Substance and Sexual Activity  . Alcohol use: Never  . Drug use: Never  . Sexual activity: Not on file  Other Topics Concern  . Not on file  Social History Narrative  . Not on file   Social Determinants of Health   Financial Resource Strain: Not on file  Food Insecurity: Not on file  Transportation Needs: Not on file  Physical Activity: Not on file  Stress: Not on file  Social Connections: Not on file    Allergies:  Allergies  Allergen Reactions  . Keflex [Cephalexin] Itching and Rash    Metabolic Disorder Labs: No results found for: HGBA1C, MPG No results found for: PROLACTIN Lab Results  Component Value Date   CHOL 147 02/07/2020   TRIG 65 02/07/2020   HDL 69 02/07/2020   CHOLHDL 2.1 02/07/2020   LDLCALC 65 02/07/2020   No results found for: TSH  Therapeutic Level Labs: No results found for: LITHIUM No results found for: VALPROATE No components found for:  CBMZ  Current Medications: Current Outpatient Medications  Medication Sig Dispense Refill  . imipramine (TOFRANIL) 50 MG tablet Take 1 tablet (50 mg total) by mouth at bedtime for 7 days, THEN 2 tablets (100 mg total) at bedtime. 67 tablet 0  . albuterol (VENTOLIN HFA) 108 (90 Base) MCG/ACT inhaler Inhale 2 puffs into the lungs every 6 (six) hours as needed for wheezing or shortness of breath.  (Patient not taking: Reported on 06/26/2020)    . ALOE PO Take by mouth. (Patient not taking: Reported on 06/26/2020)    . ALPRAZolam (XANAX) 0.5 MG tablet Take 1 tablet (0.5 mg total) by mouth 2 (two) times daily as needed for anxiety. 60 tablet 1  . aspirin-sod bicarb-citric acid (ALKA-SELTZER) 325 MG TBEF tablet Take 325 mg by mouth every 6 (six) hours as needed (acid relief).    . bifidobacterium infantis (ALIGN) capsule Take 1 capsule by mouth daily.    Marland Kitchen buPROPion (WELLBUTRIN XL) 300 MG 24 hr tablet TAKE 1 TABLET BY MOUTH EVERY DAY 90 tablet 1  . CALCIUM PO Take 1 tablet by mouth daily.    . Cholecalciferol (VITAMIN D3) 1.25 MG (50000 UT) CAPS Take 5,000 Units by mouth daily.     . ciprofloxacin (CIPRO) 500 MG tablet Take 500 mg by mouth 2 (two) times daily. (Patient not  taking: Reported on 06/26/2020)    . estradiol (ESTRACE) 0.5 MG tablet Take 0.5 mg by mouth daily. (Patient not taking: Reported on 06/26/2020)    . ezetimibe (ZETIA) 10 MG tablet Take 1 tablet (10 mg total) by mouth daily. 90 tablet 3  . gabapentin (NEURONTIN) 600 MG tablet Take 600 mg by mouth 3 (three) times daily. (Patient not taking: Reported on 06/26/2020)    . levothyroxine (SYNTHROID) 75 MCG tablet Take 75 mcg by mouth daily before breakfast.    . losartan-hydrochlorothiazide (HYZAAR) 100-25 MG tablet Take 1 tablet by mouth daily.    Marland Kitchen omeprazole (PRILOSEC) 40 MG capsule Take 40 mg by mouth daily.    . ondansetron (ZOFRAN) 8 MG tablet Take 8 mg by mouth 2 (two) times daily as needed for nausea/vomiting.    Marland Kitchen OVER THE COUNTER MEDICATION Take 1 tablet by mouth daily. [place] Aloe    . progesterone (PROMETRIUM) 100 MG capsule Take 100 mg by mouth at bedtime.    Marland Kitchen rOPINIRole (REQUIP) 3 MG tablet Take 3 mg by mouth at bedtime.    . rosuvastatin (CRESTOR) 5 MG tablet Take 5 mg by mouth daily.    . Simethicone 125 MG CAPS Take 125 mg by mouth 3 (three) times daily as needed (flatulence).    Marland Kitchen sulfamethoxazole-trimethoprim  (BACTRIM DS) 800-160 MG tablet Take 1 tablet by mouth 2 (two) times daily.    Marland Kitchen zolpidem (AMBIEN CR) 12.5 MG CR tablet Take 1 tablet (12.5 mg total) by mouth at bedtime as needed. for sleep 30 tablet 1   No current facility-administered medications for this visit.      Psychiatric Specialty Exam: Review of Systems  Psychiatric/Behavioral: Positive for sleep disturbance. The patient is nervous/anxious.   All other systems reviewed and are negative.   Last menstrual period 12/31/2012.There is no height or weight on file to calculate BMI.  General Appearance: NA  Eye Contact:  NA  Speech:  Clear and Coherent and Normal Rate  Volume:  Normal  Mood:  Anxious and Depressed  Affect:  NA  Thought Process:  Goal Directed  Orientation:  Full (Time, Place, and Person)  Thought Content: Logical   Suicidal Thoughts:  No  Homicidal Thoughts:  No  Memory:  Immediate;   Good Recent;   Good Remote;   Good  Judgement:  Good  Insight:  Good  Psychomotor Activity:  NA  Concentration:  Concentration: Good  Recall:  Good  Fund of Knowledge: Good  Language: Good  Akathisia:  Negative  Handed:  Right  AIMS (if indicated): not done  Assets:  Communication Skills Desire for Improvement Financial Resources/Insurance Housing Social Support  ADL's:  Intact  Cognition: WNL  Sleep:  Fair   Screenings: Pharmacist, community Row Counselor from 03/28/2020 in [place] [facility]  PHQ-2 Total Score 4  PHQ-9 Total Score 15       Assessment and Plan: 64y.o.marriedfemalewith a hx of depressionand anxiety. She has a hx of two IP admissions and recent (June 2021) OD on zolpidem She has tried imipramine then sertraline and duloxetine but could not tolerate sexual side. Now on escitalopram20 mg -shetolerated 10 mg well but it did not help with anxiety. After dose was increased to 20 mg she started to experience headaches, loss of libido and nausea. Anxietydid not improveeither.She  worries "about everything",but energy and focusing ability has improved after addition of Wellbutrin. Shehas been feeling lessdepressed and denies having SI. We have changed zolpidem IR to  CR and she sleeps better (still has to take0.5 mg of alprazolam at nightto be able to fall asleep).We have dc escitalopram and started vilazodone but she could not tolerate nausea it caused and stopped.She has reported over past few days that her depression worsened and that she was having passive SI (no plan or intent, contracting for safety). We have done GeneSight testing but results are not available as yet. Cheryl Burgess reports that she had good response to imipramine on which she had been Since 1985 for several years (up to 300 mg at one time). Cheryl Burgess is not interested in going back to IOP, cancelled appointment with Cheryl Burgess. We have tried nortriptyline but she could not tolerate adverse effects (headaches and body aches). We changed it to imipramine and her mood improved on 75 mg but again she developed headaches and stopped taking it about a week ago. She claims not having side effects when she was on tablet forms (50 mg) of imipramine in the past?   Dx: MDD recurrent moderate; GAD  Plan:We will resume imipramine but 50 mg tablets at HS for on week then, if well tolerated, increase dose to 100  mg at HS. We will continuebupropion XLto 300mg  in am to help with fatigue, concentration,zolpidem CR and alprazolam unchanged.Next appointment with me in5weeks.The plan was discussed with patient who had an opportunity to ask questions and these were all answered. I spend98minutes inphoneclinical contact with the patient.   Stephanie Acre, MD 10/15/2020, 3:17 PM

## 2020-10-18 DIAGNOSIS — R102 Pelvic and perineal pain: Secondary | ICD-10-CM | POA: Diagnosis not present

## 2020-10-18 DIAGNOSIS — R350 Frequency of micturition: Secondary | ICD-10-CM | POA: Diagnosis not present

## 2020-10-18 DIAGNOSIS — N3020 Other chronic cystitis without hematuria: Secondary | ICD-10-CM | POA: Diagnosis not present

## 2020-10-19 DIAGNOSIS — M25552 Pain in left hip: Secondary | ICD-10-CM | POA: Diagnosis not present

## 2020-10-19 DIAGNOSIS — M7062 Trochanteric bursitis, left hip: Secondary | ICD-10-CM | POA: Diagnosis not present

## 2020-10-31 DIAGNOSIS — Z124 Encounter for screening for malignant neoplasm of cervix: Secondary | ICD-10-CM | POA: Diagnosis not present

## 2020-10-31 DIAGNOSIS — Z6822 Body mass index (BMI) 22.0-22.9, adult: Secondary | ICD-10-CM | POA: Diagnosis not present

## 2020-11-01 DIAGNOSIS — R350 Frequency of micturition: Secondary | ICD-10-CM | POA: Diagnosis not present

## 2020-11-01 DIAGNOSIS — N3020 Other chronic cystitis without hematuria: Secondary | ICD-10-CM | POA: Diagnosis not present

## 2020-11-06 ENCOUNTER — Other Ambulatory Visit (HOSPITAL_COMMUNITY): Payer: Self-pay | Admitting: Psychiatry

## 2020-11-19 ENCOUNTER — Other Ambulatory Visit: Payer: Self-pay

## 2020-11-19 ENCOUNTER — Telehealth (INDEPENDENT_AMBULATORY_CARE_PROVIDER_SITE_OTHER): Payer: Medicare Other | Admitting: Psychiatry

## 2020-11-19 DIAGNOSIS — F411 Generalized anxiety disorder: Secondary | ICD-10-CM

## 2020-11-19 DIAGNOSIS — F330 Major depressive disorder, recurrent, mild: Secondary | ICD-10-CM

## 2020-11-19 MED ORDER — ALPRAZOLAM 0.5 MG PO TABS: 0.5000 mg | ORAL_TABLET | Freq: Two times a day (BID) | ORAL | 2 refills | Status: DC | PRN

## 2020-11-19 MED ORDER — IMIPRAMINE HCL 25 MG PO TABS: ORAL_TABLET | ORAL | 1 refills | Status: AC

## 2020-11-19 MED ORDER — BUPROPION HCL ER (XL) 300 MG PO TB24
300.0000 mg | ORAL_TABLET | Freq: Every day | ORAL | 1 refills | Status: DC
Start: 2020-11-19 — End: 2024-06-01

## 2020-11-19 MED ORDER — ZOLPIDEM TARTRATE ER 12.5 MG PO TBCR
12.5000 mg | EXTENDED_RELEASE_TABLET | Freq: Every evening | ORAL | 2 refills | Status: DC | PRN
Start: 2020-11-19 — End: 2021-02-27

## 2020-11-19 NOTE — Progress Notes (Signed)
BH MD/PA/NP OP Progress Note  11/19/2020 11:19 AM Rani Idler  MRN:  [id number] Interview was conducted using videoconferencing application and I verified that I was speaking with the correct person using two identifiers. I discussed the limitations of evaluation and management by telemedicine and  the availability of in person appointments. Patient expressed understanding and agreed to proceed. Participants in the visit: patient (location - home); physician (location - home office).  Chief Complaint: Occasional middle insomnia.  HPI: [id number].o.marriedfemalewith a hx of depressionand anxiety. She has a hx of two IP admissions and recent (June 2021) OD on zolpidem She has tried imipramine then sertraline and duloxetine but could not tolerate sexual side. Now on escitalopram20 mg -shetolerated 10 mg well but it did not help with anxiety. After dose was increased to 20 mg she started to experience headaches, loss of libido and nausea. Anxietydid not improveeither.She worries "about everything",but energy and focusing ability has improved after addition of Wellbutrin. Shehas been feeling lessdepressed and denies having SI. We have changed zolpidem IR to CR and she sleeps better (still has to take0.5 mg of alprazolam at nightto be able to fall asleep).We have dc escitalopram and started vilazodone but she could not tolerate nausea it caused and stopped.We have tried nortriptyline but she could not tolerate adverse effects (headaches and body aches). We then changed it to imipramine, which she had a good response to in the past, and her mood improved on 75 mg but again she developed headaches and stopped taking it about a week ago. She claims not having side effects when she was on tablet forms so we changed capsules to tablets. She tolerates these well and her mood has improved again.    Visit Diagnosis:    ICD-10-CM   1. GAD (generalized anxiety disorder)  F41.1   2. Major depressive  disorder, recurrent episode, mild (HCC)  F33.0     Past Psychiatric History: Please see intake H&P.  Past Medical History:  Past Medical History:  Diagnosis Date  . Ambien use disorder, mild, abuse ([place])   . Anemia, unspecified   . Atherosclerosis of aorta ([place])   . Benzodiazepine abuse ([place])   . COPD (chronic obstructive pulmonary disease) ([place])   . Depression   . Hypertension   . Hypothyroidism   . Insomnia   . Menopausal syndrome (hot flashes)   . Recurrent UTI   . RLS (restless legs syndrome)   . Superficial basal cell carcinoma (BCC) 04/19/2013   Post Right Knee (Cx3,5FU)  . Thalassemia minor     Past Surgical History:  Procedure Laterality Date  . calf implants    . COLONOSCOPY    . ENDOSCOPIC RETROGRADE CHOLANGIOPANCREATOGRAPHY (ERCP) WITH PROPOFOL N/A 07/04/2020   Procedure: ENDOSCOPIC RETROGRADE CHOLANGIOPANCREATOGRAPHY (ERCP) WITH PROPOFOL;  Surgeon: Ronnette Juniper, MD;  Location: [facility];  Service: Gastroenterology;  Laterality: N/A;  . REMOVAL OF STONES  07/04/2020   Procedure: REMOVAL OF STONES;  Surgeon: Ronnette Juniper, MD;  Location: [facility];  Service: Gastroenterology;;  . Joan Mayans  07/04/2020   Procedure: SPHINCTEROTOMY;  Surgeon: Ronnette Juniper, MD;  Location: [facility];  Service: Gastroenterology;;    Family Psychiatric History: Reviewed.  Family History:  Family History  Problem Relation Age of Onset  . Osteoporosis Mother   . Macular degeneration Mother   . Cancer Mother        bladder  . Alzheimer's disease Mother   . Dementia Mother   . Parkinson's disease Mother   . CAD Father   .  Hypertension Father   . Diabetes Father   . Depression Father   . Kidney disease Brother   . Breast cancer Neg Hx     Social History:  Social History   Socioeconomic History  . Marital status: Married    Spouse name: Not on file  . Number of children: 2  . Years of education: Not on file  . Highest education level: Not on file  Occupational  History  . Not on file  Tobacco Use  . Smoking status: Former Smoker    Packs/day: 2.00    Years: 30.00    Pack years: 60.00    Quit date: 2000    Years since quitting: 22.1  . Smokeless tobacco: Never Used  Vaping Use  . Vaping Use: Never used  Substance and Sexual Activity  . Alcohol use: Never  . Drug use: Never  . Sexual activity: Not on file  Other Topics Concern  . Not on file  Social History Narrative  . Not on file   Social Determinants of Health   Financial Resource Strain: Not on file  Food Insecurity: Not on file  Transportation Needs: Not on file  Physical Activity: Not on file  Stress: Not on file  Social Connections: Not on file    Allergies:  Allergies  Allergen Reactions  . Keflex [Cephalexin] Itching and Rash    Metabolic Disorder Labs: No results found for: HGBA1C, MPG No results found for: PROLACTIN Lab Results  Component Value Date   CHOL 147 02/07/2020   TRIG 65 02/07/2020   HDL 69 02/07/2020   CHOLHDL 2.1 02/07/2020   LDLCALC 65 02/07/2020   No results found for: TSH  Therapeutic Level Labs: No results found for: LITHIUM No results found for: VALPROATE No components found for:  CBMZ  Current Medications: Current Outpatient Medications  Medication Sig Dispense Refill  . albuterol (VENTOLIN HFA) 108 (90 Base) MCG/ACT inhaler Inhale 2 puffs into the lungs every 6 (six) hours as needed for wheezing or shortness of breath. (Patient not taking: Reported on 06/26/2020)    . ALOE PO Take by mouth. (Patient not taking: Reported on 06/26/2020)    . [START ON 12/07/2020] ALPRAZolam (XANAX) 0.5 MG tablet Take 1 tablet (0.5 mg total) by mouth 2 (two) times daily as needed for anxiety. 60 tablet 2  . aspirin-sod bicarb-citric acid (ALKA-SELTZER) 325 MG TBEF tablet Take 325 mg by mouth every 6 (six) hours as needed (acid relief).    . bifidobacterium infantis (ALIGN) capsule Take 1 capsule by mouth daily.    Marland Kitchen buPROPion (WELLBUTRIN XL) 300 MG 24 hr  tablet Take 1 tablet (300 mg total) by mouth daily. 90 tablet 1  . CALCIUM PO Take 1 tablet by mouth daily.    . Cholecalciferol (VITAMIN D3) 1.25 MG (50000 UT) CAPS Take 5,000 Units by mouth daily.     . ciprofloxacin (CIPRO) 500 MG tablet Take 500 mg by mouth 2 (two) times daily. (Patient not taking: Reported on 06/26/2020)    . estradiol (ESTRACE) 0.5 MG tablet Take 0.5 mg by mouth daily. (Patient not taking: Reported on 06/26/2020)    . ezetimibe (ZETIA) 10 MG tablet Take 1 tablet (10 mg total) by mouth daily. 90 tablet 3  . gabapentin (NEURONTIN) 600 MG tablet Take 600 mg by mouth 3 (three) times daily. (Patient not taking: Reported on 06/26/2020)    . imipramine (TOFRANIL) 25 MG tablet Take one tablet in AM and two at bedtime. 270 tablet 1  .  levothyroxine (SYNTHROID) 75 MCG tablet Take 75 mcg by mouth daily before breakfast.    . losartan-hydrochlorothiazide (HYZAAR) 100-25 MG tablet Take 1 tablet by mouth daily.    Marland Kitchen omeprazole (PRILOSEC) 40 MG capsule Take 40 mg by mouth daily.    . ondansetron (ZOFRAN) 8 MG tablet Take 8 mg by mouth 2 (two) times daily as needed for nausea/vomiting.    Marland Kitchen OVER THE COUNTER MEDICATION Take 1 tablet by mouth daily. [place] Aloe    . progesterone (PROMETRIUM) 100 MG capsule Take 100 mg by mouth at bedtime.    Marland Kitchen rOPINIRole (REQUIP) 3 MG tablet Take 3 mg by mouth at bedtime.    . rosuvastatin (CRESTOR) 5 MG tablet Take 5 mg by mouth daily.    . Simethicone 125 MG CAPS Take 125 mg by mouth 3 (three) times daily as needed (flatulence).    Marland Kitchen sulfamethoxazole-trimethoprim (BACTRIM DS) 800-160 MG tablet Take 1 tablet by mouth 2 (two) times daily.    Marland Kitchen zolpidem (AMBIEN CR) 12.5 MG CR tablet Take 1 tablet (12.5 mg total) by mouth at bedtime as needed. for sleep 30 tablet 2   No current facility-administered medications for this visit.     Psychiatric Specialty Exam: Review of Systems  Psychiatric/Behavioral: Positive for sleep disturbance. The patient is  nervous/anxious.   All other systems reviewed and are negative.   Last menstrual period 12/31/2012.There is no height or weight on file to calculate BMI.  General Appearance: Casual and Well Groomed  Eye Contact:  Good  Speech:  Clear and Coherent and Normal Rate  Volume:  Normal  Mood:  Some anxiety and depression  Affect:  Full Range  Thought Process:  Goal Directed  Orientation:  Full (Time, Place, and Person)  Thought Content: Logical   Suicidal Thoughts:  No  Homicidal Thoughts:  No  Memory:  Immediate;   Good Recent;   Good Remote;   Good  Judgement:  Good  Insight:  Fair  Psychomotor Activity:  Normal  Concentration:  Concentration: Good  Recall:  Good  Fund of Knowledge: Good  Language: Good  Akathisia:  Negative  Handed:  Right  AIMS (if indicated): not done  Assets:  Communication Skills Desire for Improvement Financial Resources/Insurance Housing Social Support  ADL's:  Intact  Cognition: WNL  Sleep:  Fair   Screenings: Pharmacist, community Row Counselor from 03/28/2020 in [place] [facility]  PHQ-2 Total Score 4  PHQ-9 Total Score 15       Assessment and Plan: 64y.o.marriedfemalewith a hx of depressionand anxiety. She has a hx of two IP admissions and recent (June 2021) OD on zolpidem She has tried imipramine then sertraline and duloxetine but could not tolerate sexual side. Now on escitalopram20 mg -shetolerated 10 mg well but it did not help with anxiety. After dose was increased to 20 mg she started to experience headaches, loss of libido and nausea. Anxietydid not improveeither.She worries "about everything",but energy and focusing ability has improved after addition of Wellbutrin. Shehas been feeling lessdepressed and denies having SI. We have changed zolpidem IR to CR and she sleeps better (still has to take0.5 mg of alprazolam at nightto be able to fall asleep).We have dc escitalopram and started vilazodone but she could not  tolerate nausea it caused and stopped.We have tried nortriptyline but she could not tolerate adverse effects (headaches and body aches). We then changed it to imipramine, which she had a good response to in the past, and her mood  improved on 75 mg but again she developed headaches and stopped taking it about a week ago. She claims not having side effects when she was on tablet forms so we changed capsules to tablets. She tolerates these well and her mood has improved again.   Dx: MDD recurrent mild; GAD  Plan:We willcontinue imipramine tablets 25 mg in am and 50 mg at HS, bupropion XL 300mg  in am to help with fatigue, concentration,zolpidem CR and alprazolam unchanged.Next appointment in 3 months with anew provider.The plan was discussed with patient who had an opportunity to ask questions and these were all answered. I spend83minutes invideoclinical contact with the patient.   Stephanie Acre, MD 11/19/2020, 11:19 AM

## 2020-11-29 ENCOUNTER — Ambulatory Visit
Admission: RE | Admit: 2020-11-29 | Discharge: 2020-11-29 | Disposition: A | Discharge status: Home / Self Care | Payer: Medicare Other | Source: Ambulatory Visit | Attending: Obstetrics & Gynecology | Admitting: Obstetrics & Gynecology

## 2020-11-29 ENCOUNTER — Other Ambulatory Visit: Payer: Self-pay

## 2020-11-29 DIAGNOSIS — Z1231 Encounter for screening mammogram for malignant neoplasm of breast: Secondary | ICD-10-CM | POA: Diagnosis not present

## 2020-12-04 IMAGING — MR MR ABDOMEN WO/W CM MRCP
13 of 21 series · 22 of 48 positions shown · IV contrast (gadavist)
Comparison: None.

CLINICAL DATA: Epigastric pain.

EXAM:
MRI ABDOMEN WITHOUT AND WITH CONTRAST (INCLUDING MRCP)
TECHNIQUE: Multiplanar multisequence MR imaging of the abdomen was performed
both before and after the administration of intravenous contrast.
Heavily T2-weighted images of the biliary and pancreatic ducts were
obtained, and three-dimensional MRCP images were rendered by post
processing.
CONTRAST:  6mL GADAVIST GADOBUTROL 1 MMOL/ML IV SOLN

[Series 3: cor ssfse nav · coronal · 6.0mm · 0.74mm/px · 1 of 30 slices shown]
[im 1/30]
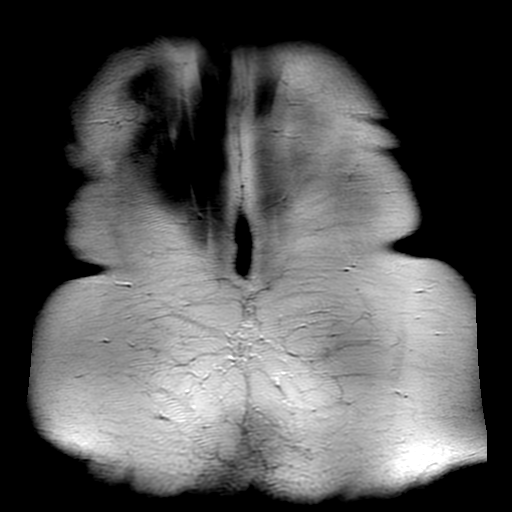

[Series 4: ax ssfse nav · axial · 6.0mm · 0.66mm/px · 1 of 40 slices shown]
[im 1/40]
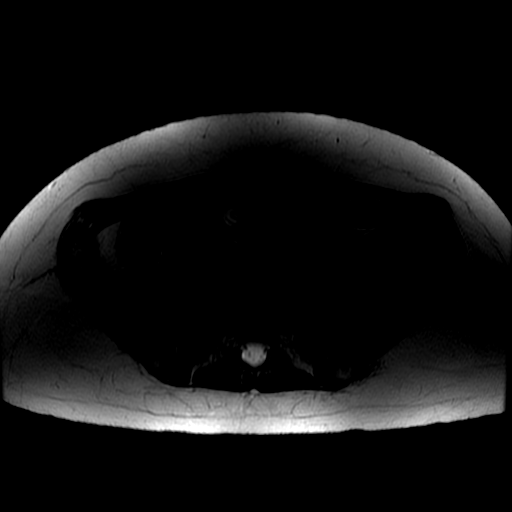

[Series 5: T2 fat-sat · axial · 6.0mm · 0.66mm/px · 1 of 40 slices shown]
[im 1/40]
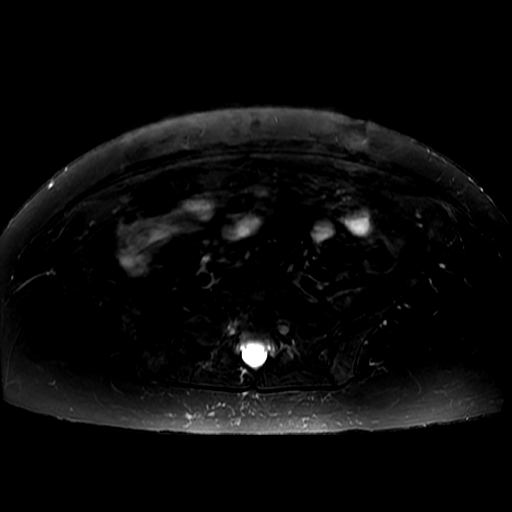

[Series 6: DWI b500 · axial · 8.0mm · 1.41mm/px · 1 of 64 slices shown]
[im 1/64]
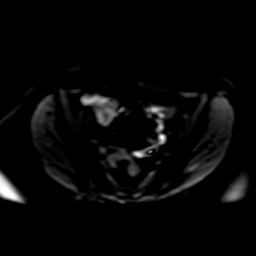

[Series 7: T1 dynamic · axial · 5.0mm · 0.66mm/px · z∈[-5,+253]mm · 2 of 104 slices shown (1 of 4)]
[im 1/104]
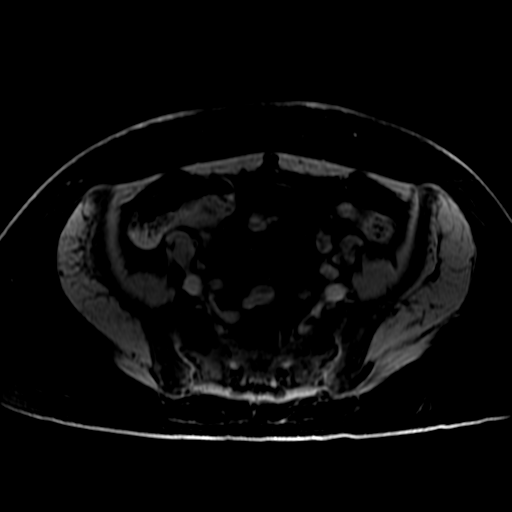
[im 104/104]
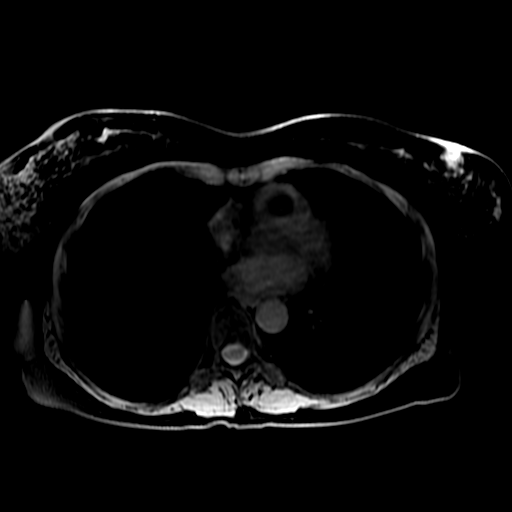

[Series 9: radial 2d thick · coronal · 40.0mm · 0.86mm/px · 1 of 6 slices shown]
[im 1/6]
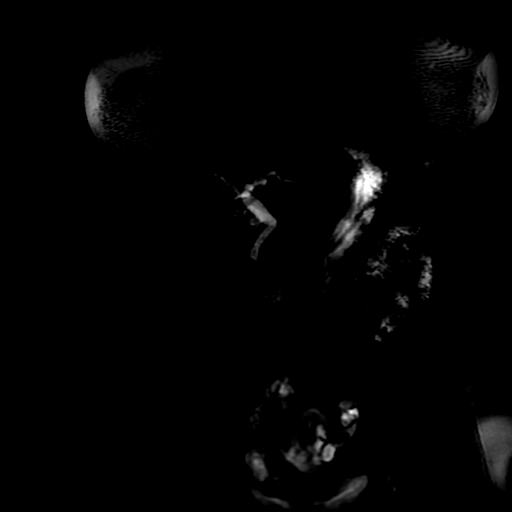

[Series 10: bSSFP · coronal · 6.0mm · 0.78mm/px · 1 of 30 slices shown]
[im 1/30]
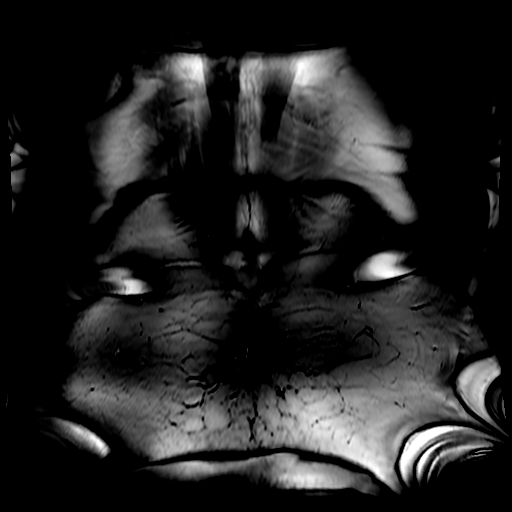

[Series 12: T1 dynamic · coronal · 3.4mm · 1.56mm/px · 3 of 112 slices shown (2 of 4)]
[im 1/112]
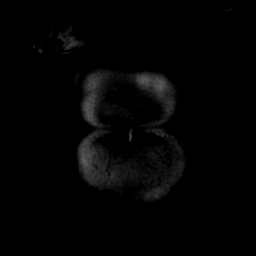
[im 56/112]
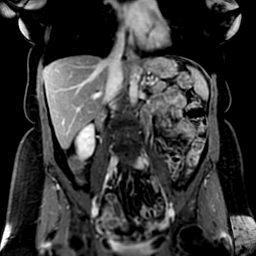
[im 112/112]
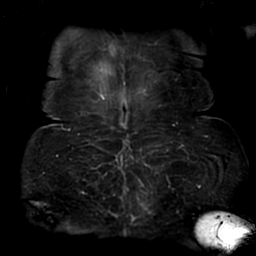

[Series 650: ADC · axial · 8.0mm · 1.41mm/px · 1 of 32 slices shown]
[im 1/32]
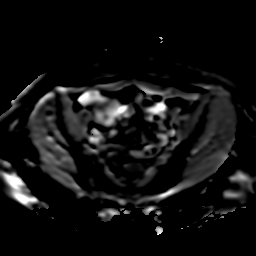

[Series 701: T1 dynamic · axial · 5.0mm · 0.66mm/px · z∈[-5,+253]mm · 3 of 104 slices shown (3 of 4)]
[im 1/104]
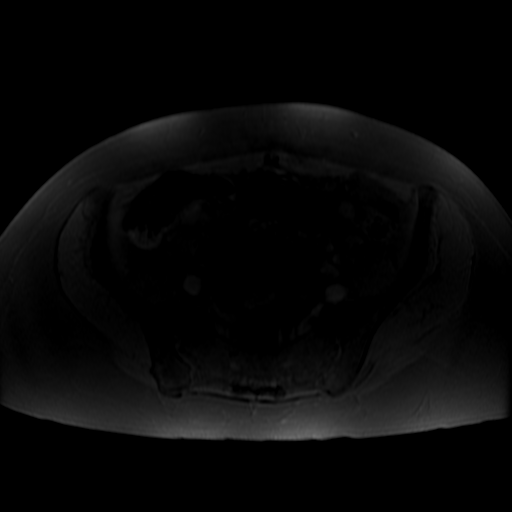
[im 52/104]
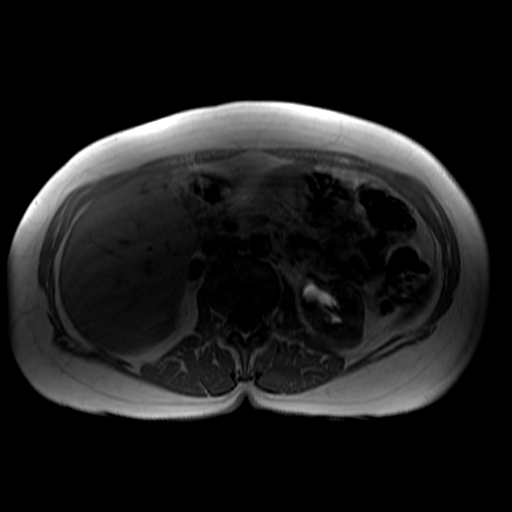
[im 104/104]
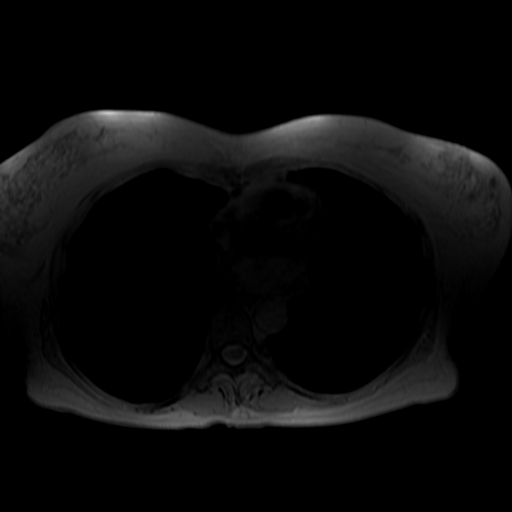

[Series 702: T1 dynamic · axial · 5.0mm · 0.66mm/px · z∈[-5,+253]mm · 3 of 104 slices shown (4 of 4)]
[im 1/104]
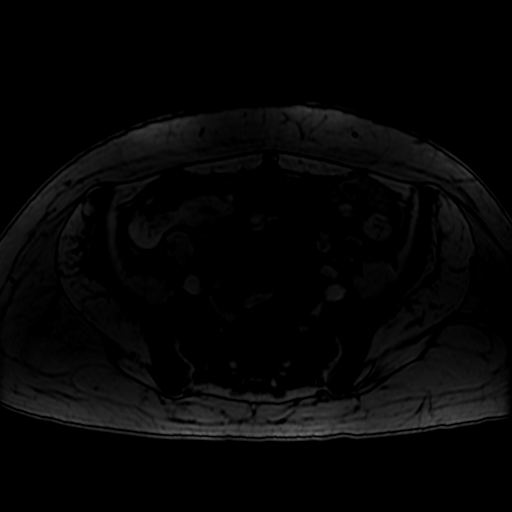
[im 52/104]
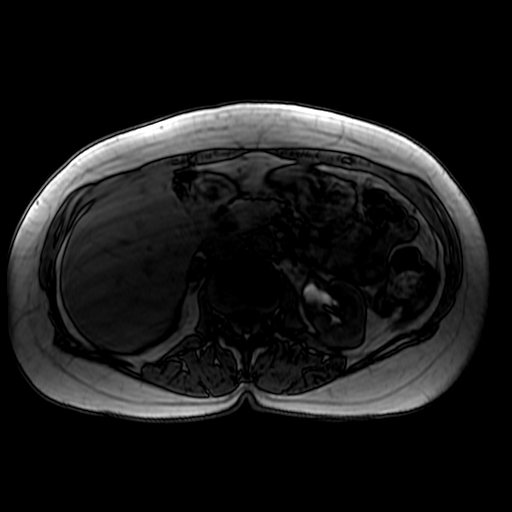
[im 104/104]
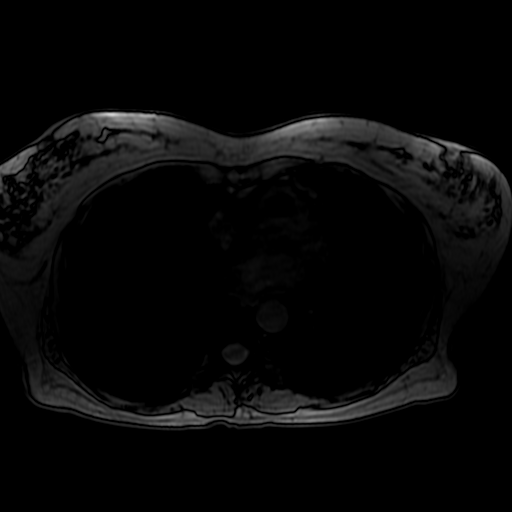

[Series 1100: T1 dynamic post-contrast · axial · non-contrast · 4.0mm · 0.74mm/px · z∈[+11,+257]mm · 3 of 124 slices shown (1 of 2)]
[im 1/124]
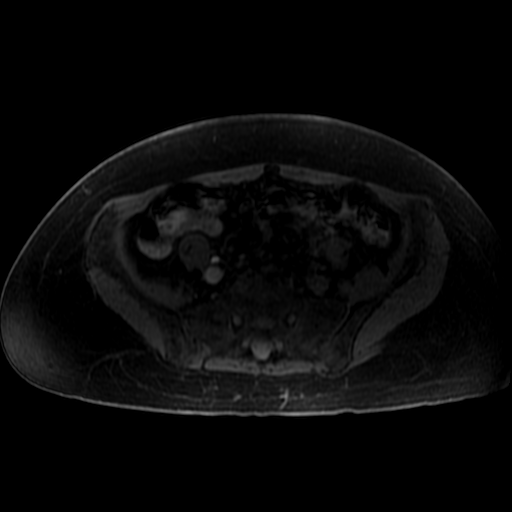
[im 62/124]
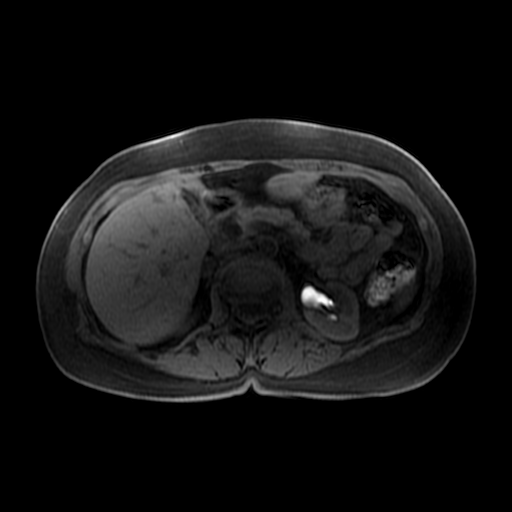
[im 124/124]
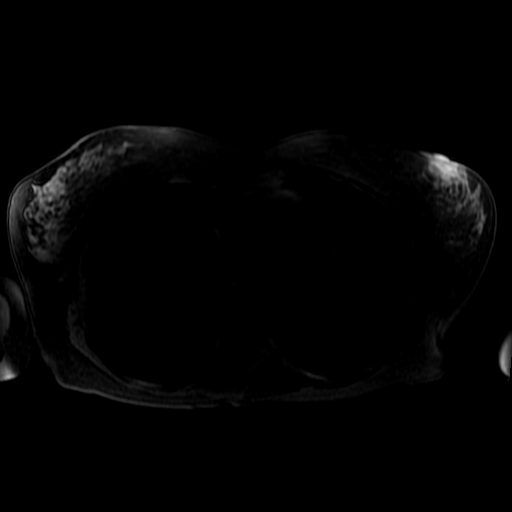

[Series 1101: T1 dynamic post-contrast · axial · non-contrast · 4.0mm · 0.74mm/px · 1 of 124 slices shown (2 of 2)]
[im 1/124]
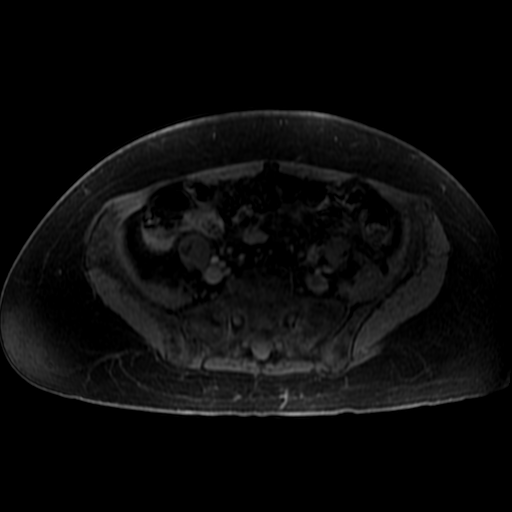

[22 of 48 positions shown; findings below may reference images not displayed]

FINDINGS: Lower chest: No acute findings.

Hepatobiliary: No hepatic masses identified. Gallbladder is not
visualized. Mild diffuse biliary ductal dilatation is seen with
common bile duct measuring 12 mm. A 5 mm calculus is seen in the
distal common bile duct. No evidence of biliary stricture.

Pancreas: No mass or inflammatory changes. No evidence of pancreatic
ductal dilatation or pancreas divisum.

Spleen:  Within normal limits in size and appearance.

Adrenals/Urinary Tract: No masses identified. No evidence of
hydronephrosis.

Stomach/Bowel: Visualized portion unremarkable.

Vascular/Lymphatic: No pathologically enlarged lymph nodes
identified. No abdominal aortic aneurysm.

Other:  None.

Musculoskeletal:  No suspicious bone lesions identified.
IMPRESSION: Nonvisualization of gallbladder.  Question prior cholecystectomy.

Mild diffuse biliary ductal dilatation, with 5 mm calculus in distal
common bile duct.

## 2021-01-02 IMAGING — RF DG ERCP WO/W SPHINCTEROTOMY
1 series · 15 of 20 positions shown · non-contrast
Comparison: 06/05/2020

CLINICAL DATA: Choledocholithiasis with biliary dilatation by MRCP

EXAM:
ERCP with balloon sweep for stone removal
TECHNIQUE: Multiple spot images obtained with the fluoroscopic device and
submitted for interpretation post-procedure.
FLUOROSCOPY TIME:  Fluoroscopy Time:  3 minutes 12 seconds

[Series 1: run · 17 acquisitions, 15 frames shown]
[im 1/17]
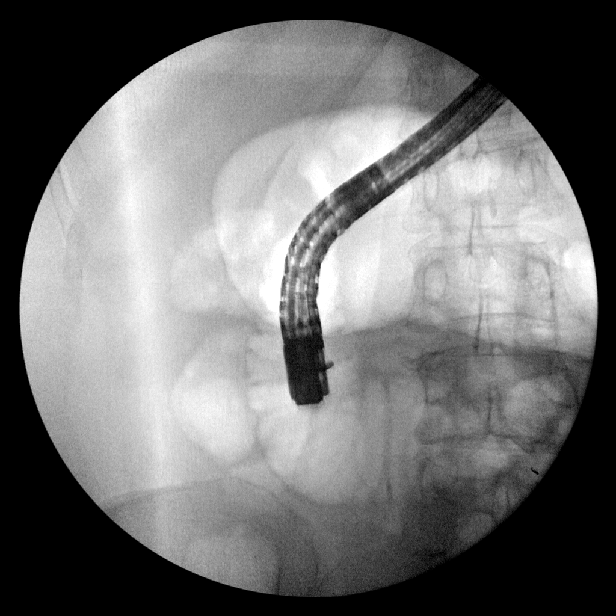
[im 3/17]
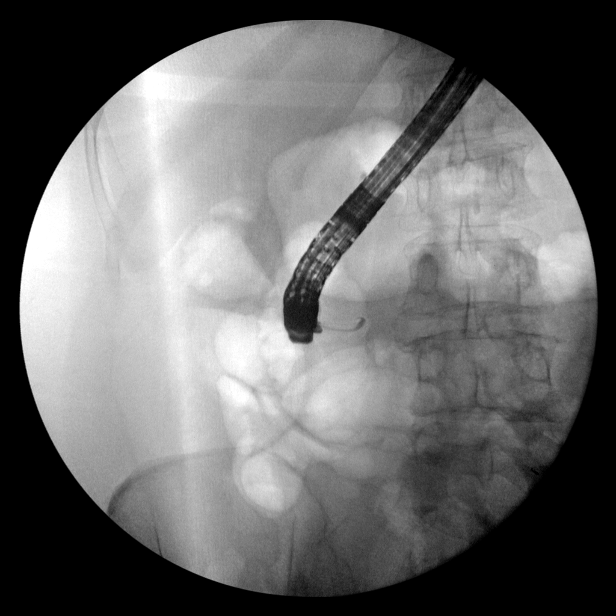
[im 3/17]
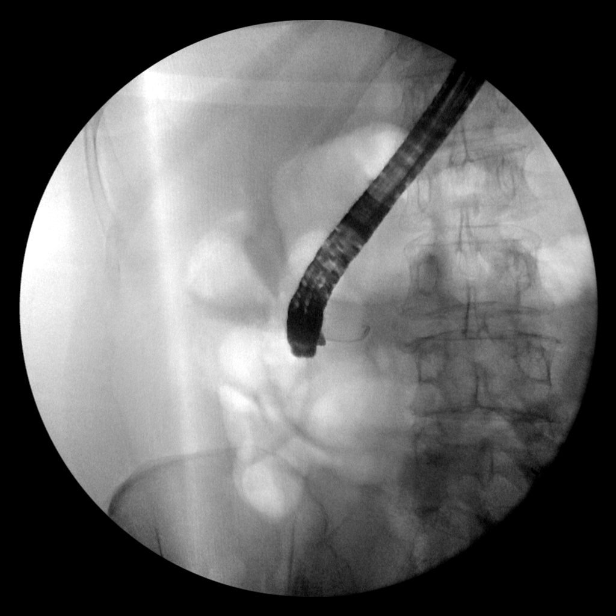
[im 3/17]
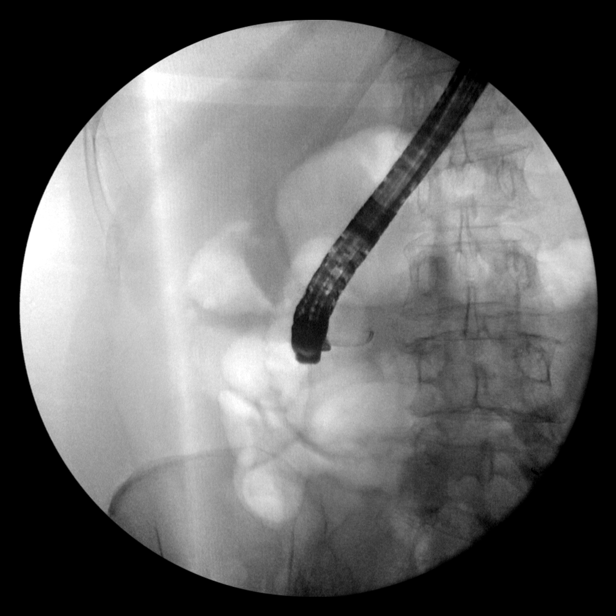
[im 4/17]
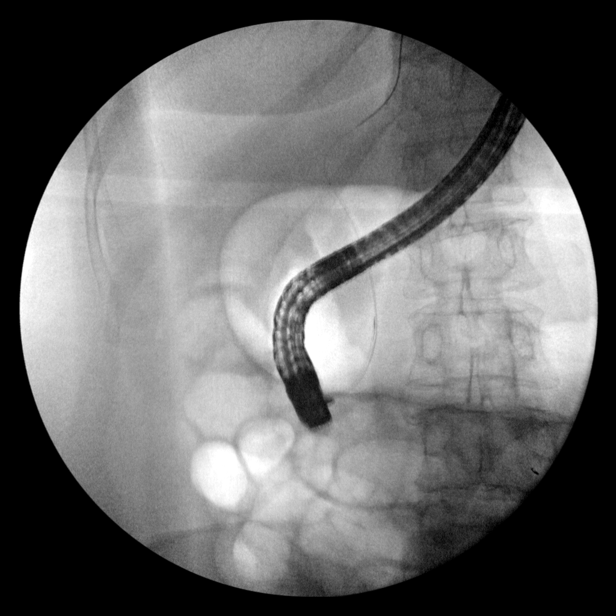
[im 5/17]
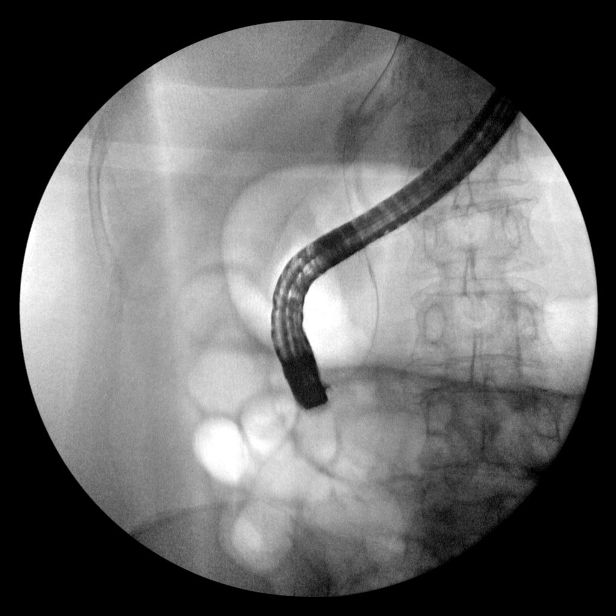
[im 6/17]
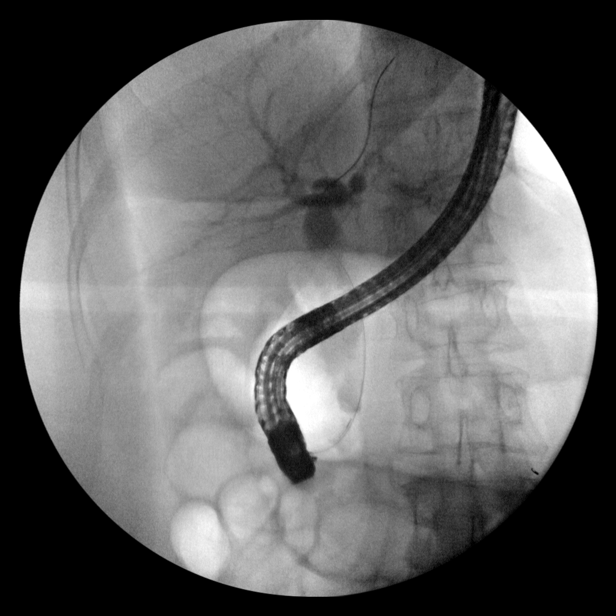
[im 8/17]
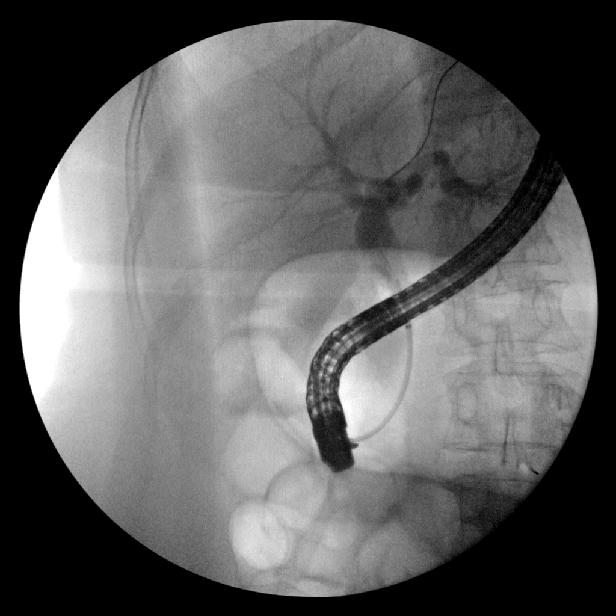
[im 9/17]
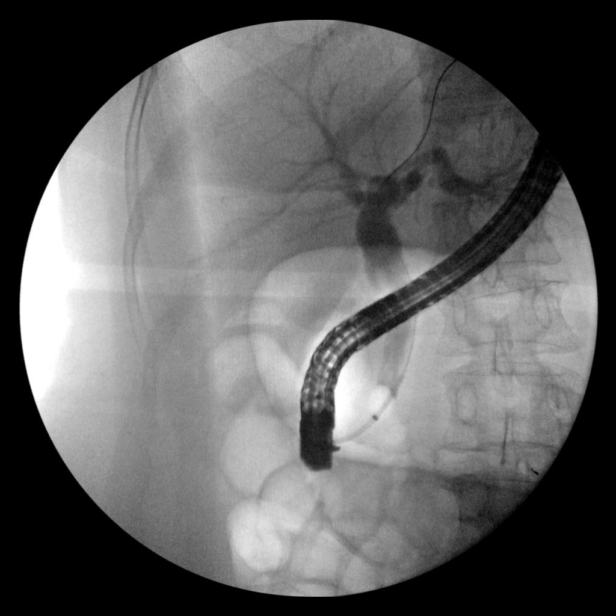
[im 10/17]
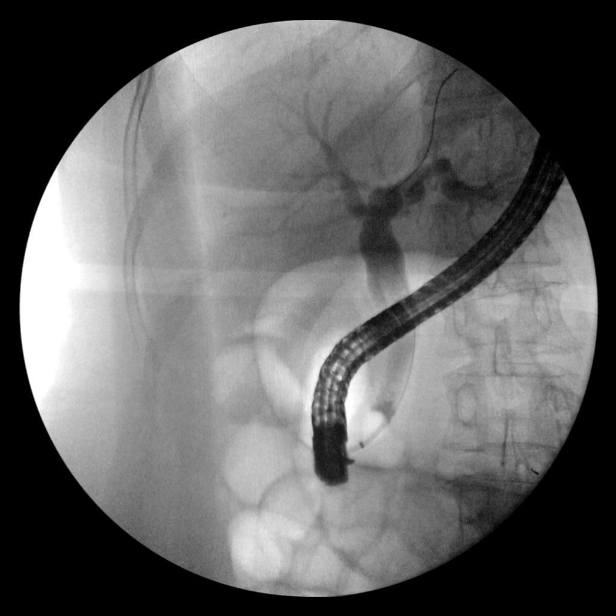
[im 12/17]
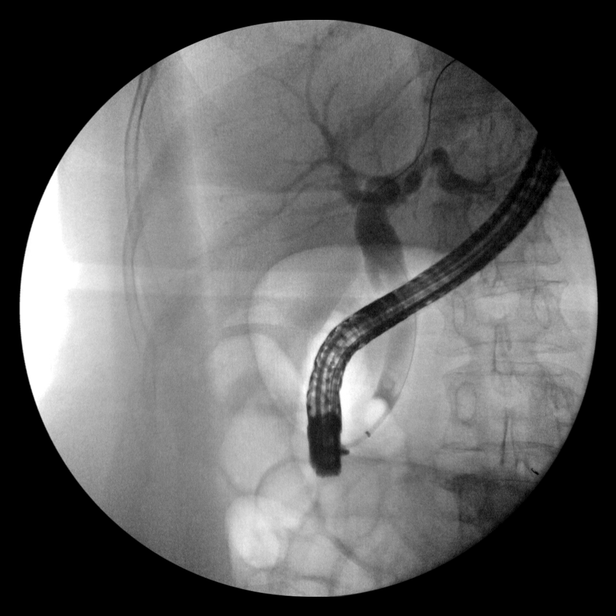
[im 13/17]
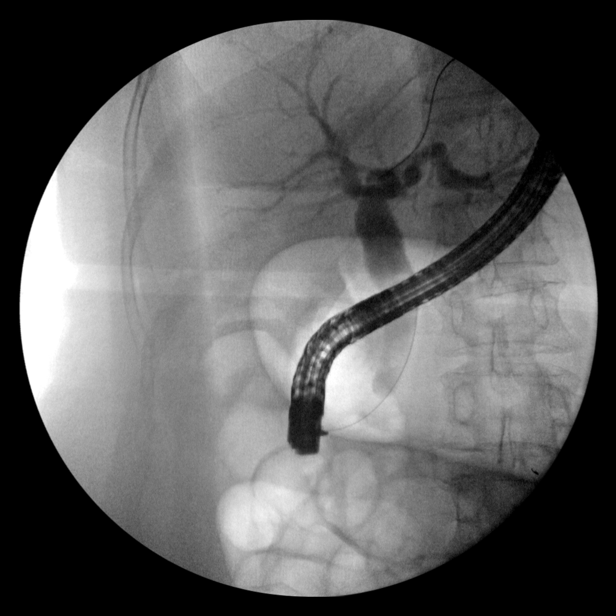
[im 14/17]
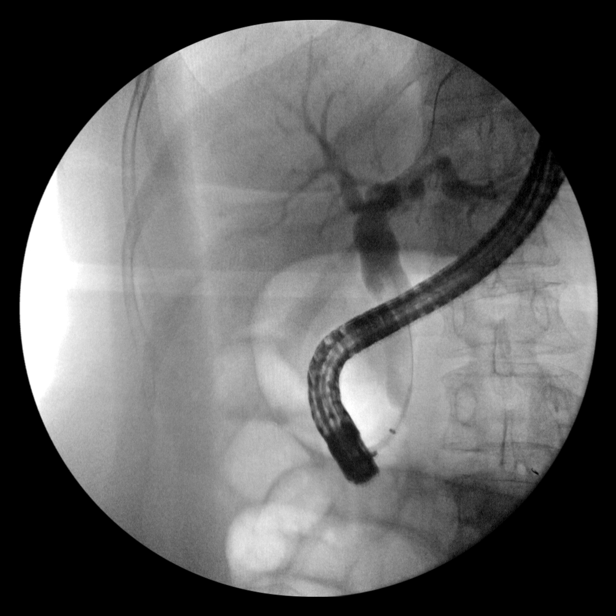
[im 16/17]
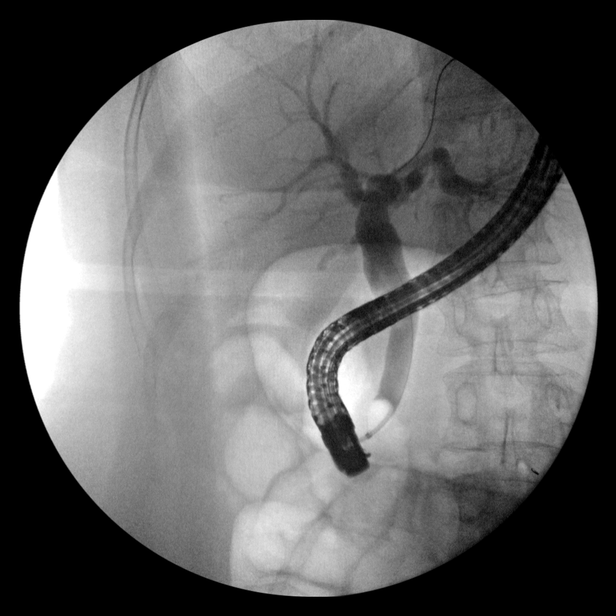
[im 17/17]
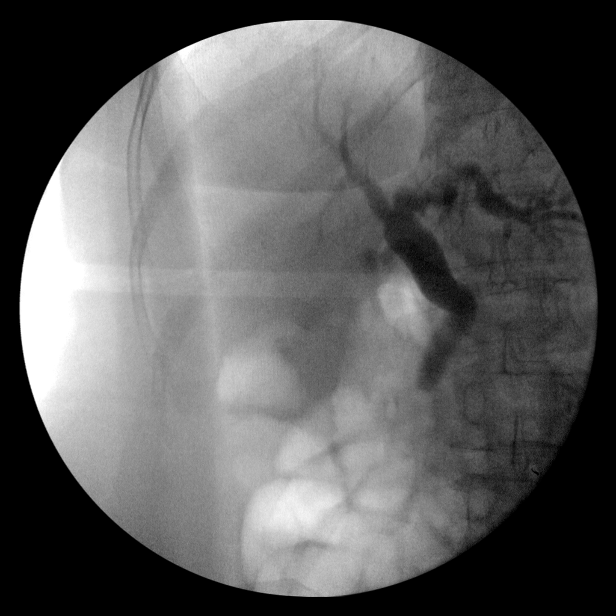

[15 of 20 positions shown; findings below may reference images not displayed]

FINDINGS: Spot fluoroscopic views during the ERCP procedure intervention. This
demonstrates retrograde guidewire access and contrast injection.
Biliary dilatation noted predominantly of the common bile duct.
Balloon sweep performed. Final image demonstrates retained contrast
in the biliary tree.
IMPRESSION: Limited imaging during ERCP with balloon sweep. Biliary dilatation
noted.

These images were submitted for radiologic interpretation only.
Please see the procedural report for the amount of contrast and the
fluoroscopy time utilized.

## 2021-01-04 DIAGNOSIS — R21 Rash and other nonspecific skin eruption: Secondary | ICD-10-CM | POA: Diagnosis not present

## 2021-01-08 ENCOUNTER — Telehealth (HOSPITAL_COMMUNITY): Payer: Self-pay | Admitting: *Deleted

## 2021-01-08 NOTE — Telephone Encounter (Signed)
Prior authorization started for Ambien.  Awaiting response.  PA sent to [place].

## 2021-01-09 ENCOUNTER — Telehealth (HOSPITAL_COMMUNITY): Payer: Self-pay | Admitting: *Deleted

## 2021-01-09 NOTE — Telephone Encounter (Signed)
Prior authorization obtained for Ambien.  Dates are: 01/08/21 through 10/05/21.

## 2021-01-24 DIAGNOSIS — R296 Repeated falls: Secondary | ICD-10-CM | POA: Diagnosis not present

## 2021-01-24 DIAGNOSIS — R42 Dizziness and giddiness: Secondary | ICD-10-CM | POA: Diagnosis not present

## 2021-01-24 DIAGNOSIS — M25552 Pain in left hip: Secondary | ICD-10-CM | POA: Diagnosis not present

## 2021-02-07 DIAGNOSIS — H903 Sensorineural hearing loss, bilateral: Secondary | ICD-10-CM | POA: Insufficient documentation

## 2021-02-07 DIAGNOSIS — R42 Dizziness and giddiness: Secondary | ICD-10-CM | POA: Diagnosis not present

## 2021-02-08 ENCOUNTER — Other Ambulatory Visit: Payer: Self-pay | Admitting: Physician Assistant

## 2021-02-08 DIAGNOSIS — R42 Dizziness and giddiness: Secondary | ICD-10-CM

## 2021-02-24 ENCOUNTER — Other Ambulatory Visit: Payer: Self-pay

## 2021-02-24 ENCOUNTER — Ambulatory Visit
Admission: RE | Admit: 2021-02-24 | Discharge: 2021-02-24 | Disposition: A | Discharge status: Home / Self Care | Payer: Medicare Other | Source: Ambulatory Visit | Attending: Physician Assistant | Admitting: Physician Assistant

## 2021-02-24 DIAGNOSIS — R42 Dizziness and giddiness: Secondary | ICD-10-CM

## 2021-02-24 DIAGNOSIS — I6782 Cerebral ischemia: Secondary | ICD-10-CM | POA: Diagnosis not present

## 2021-02-24 MED ORDER — GADOBENATE DIMEGLUMINE 529 MG/ML IV SOLN
10.0000 mL | Freq: Once | INTRAVENOUS | Status: AC | PRN
  Administered 2021-02-24: 10 mL via INTRAVENOUS

## 2021-02-27 ENCOUNTER — Telehealth (HOSPITAL_COMMUNITY): Payer: Self-pay | Admitting: *Deleted

## 2021-02-27 DIAGNOSIS — D563 Thalassemia minor: Secondary | ICD-10-CM | POA: Diagnosis not present

## 2021-02-27 DIAGNOSIS — E871 Hypo-osmolality and hyponatremia: Secondary | ICD-10-CM | POA: Diagnosis not present

## 2021-02-27 DIAGNOSIS — R42 Dizziness and giddiness: Secondary | ICD-10-CM | POA: Diagnosis not present

## 2021-02-27 DIAGNOSIS — Z789 Other specified health status: Secondary | ICD-10-CM | POA: Diagnosis not present

## 2021-02-27 DIAGNOSIS — I10 Essential (primary) hypertension: Secondary | ICD-10-CM | POA: Diagnosis not present

## 2021-02-27 DIAGNOSIS — E039 Hypothyroidism, unspecified: Secondary | ICD-10-CM | POA: Diagnosis not present

## 2021-02-27 DIAGNOSIS — R413 Other amnesia: Secondary | ICD-10-CM | POA: Diagnosis not present

## 2021-02-27 DIAGNOSIS — F322 Major depressive disorder, single episode, severe without psychotic features: Secondary | ICD-10-CM | POA: Diagnosis not present

## 2021-02-27 MED ORDER — ALPRAZOLAM 0.5 MG PO TABS
0.5000 mg | ORAL_TABLET | Freq: Two times a day (BID) | ORAL | 0 refills | Status: AC | PRN
Start: 2021-02-27 — End: 2021-05-28

## 2021-02-27 MED ORDER — ZOLPIDEM TARTRATE ER 12.5 MG PO TBCR: 12.5000 mg | EXTENDED_RELEASE_TABLET | Freq: Every evening | ORAL | 0 refills | Status: DC | PRN

## 2021-02-27 NOTE — Telephone Encounter (Signed)
Pt called requesting refills on the Xanax and Ambien. Looks like Xanax was actually last written 02/06/21. All other meds have more than #30 fills. Writer to call confirming refills and discussing letter being sent to pt's of Dr. Montel Culver.

## 2021-02-27 NOTE — Telephone Encounter (Signed)
A Thirty days bridge supply of Ambien and xanax given by Covering MD. Patient must established care with new provider.

## 2021-03-15 ENCOUNTER — Telehealth (HOSPITAL_COMMUNITY): Payer: Self-pay | Admitting: *Deleted

## 2021-03-15 NOTE — Telephone Encounter (Signed)
Both medication given on February 14 for 90 days has additional 1 more refill so she should be good until August 14.  Please send a letter to establish care with a new provider.

## 2021-03-15 NOTE — Telephone Encounter (Signed)
Former pt of Dr. Montel Culver requesting refill of Wellbutrin XL 300 mg and Tofranil 25 mg one qam and 2 qhs. Last written 11/19/20 for 90 days. Thanks.

## 2021-03-18 ENCOUNTER — Other Ambulatory Visit (HOSPITAL_COMMUNITY): Payer: Self-pay | Admitting: Psychiatry

## 2021-03-18 DIAGNOSIS — H25013 Cortical age-related cataract, bilateral: Secondary | ICD-10-CM | POA: Diagnosis not present

## 2021-03-18 DIAGNOSIS — H524 Presbyopia: Secondary | ICD-10-CM | POA: Diagnosis not present

## 2021-03-18 DIAGNOSIS — H2513 Age-related nuclear cataract, bilateral: Secondary | ICD-10-CM | POA: Diagnosis not present

## 2021-03-19 ENCOUNTER — Ambulatory Visit: Payer: Medicare Other | Admitting: Physician Assistant

## 2021-03-25 ENCOUNTER — Other Ambulatory Visit: Payer: Self-pay

## 2021-03-25 ENCOUNTER — Ambulatory Visit (INDEPENDENT_AMBULATORY_CARE_PROVIDER_SITE_OTHER): Payer: Medicare Other | Admitting: Dermatology

## 2021-03-25 DIAGNOSIS — L570 Actinic keratosis: Secondary | ICD-10-CM | POA: Diagnosis not present

## 2021-03-25 DIAGNOSIS — Z1283 Encounter for screening for malignant neoplasm of skin: Secondary | ICD-10-CM | POA: Diagnosis not present

## 2021-03-25 DIAGNOSIS — L309 Dermatitis, unspecified: Secondary | ICD-10-CM

## 2021-03-25 DIAGNOSIS — Z85828 Personal history of other malignant neoplasm of skin: Secondary | ICD-10-CM | POA: Diagnosis not present

## 2021-03-25 DIAGNOSIS — L821 Other seborrheic keratosis: Secondary | ICD-10-CM | POA: Diagnosis not present

## 2021-03-25 MED ORDER — FLUOCINONIDE 0.05 % EX SOLN: 1.0000 "application " | Freq: Two times a day (BID) | CUTANEOUS | 3 refills | Status: DC | PRN

## 2021-03-26 ENCOUNTER — Telehealth: Payer: Self-pay

## 2021-03-26 DIAGNOSIS — F411 Generalized anxiety disorder: Secondary | ICD-10-CM | POA: Diagnosis not present

## 2021-03-26 DIAGNOSIS — F331 Major depressive disorder, recurrent, moderate: Secondary | ICD-10-CM | POA: Diagnosis not present

## 2021-03-26 NOTE — Telephone Encounter (Signed)
-----   Message from Lavonna Monarch, MD sent at 03/26/2021  1:13 PM EDT ----- Regarding: RE: Medication First choice would be any of these that come as a solution or foam.  If none are available as such, choose the betamethasone and have her use it for a few hours before she shampoos her hair; this could be less than every day.  She may use it on the nonscalp areas daily after bathing. ----- Message ----- From: Lennie Odor, CMA Sent: 03/25/2021   2:15 PM EDT To: Lavonna Monarch, MD Subject: Medication                                     Per patient's Pharmacy the Fluocinonide 0.05% Solution isn't covered by the patient's insurance company.  Per patient's Pharmacy the preferred products are Mometasone Furoate, Triamcinolone Acetonide and Betamethasone Dipropionate. Which medication would you like to change to?  Pharmacy [facility]

## 2021-03-26 NOTE — Telephone Encounter (Signed)
Cash pay with goodrx 30$

## 2021-03-30 ENCOUNTER — Encounter: Payer: Self-pay | Admitting: Dermatology

## 2021-03-30 NOTE — Progress Notes (Signed)
   Follow-Up Visit   Subjective  Cheryl Burgess is a 65 y.o. female who presents for the following: Annual Exam (Here for annual skin exam. Concerns between breast that hurts. Husband says there is a spot on patients back. Patient does have history of BCC. ).  Annual skin examination, several areas to check. Location:  Duration:  Quality:  Associated Signs/Symptoms: Modifying Factors:  Severity:  Timing: Context:   Objective  Well appearing patient in no apparent distress; mood and affect are within normal limits. No atypical pigmented lesion or recurrent nonmelanoma skin cancer.  Subtle waxy pink spot on right inner elbow could represent superficial BCC.  Will biopsy if there is any growth or bleeding.  Chest - Medial (Center) Slightly tender hornlike pink 3 mm crust  Left Popliteal Fossa, Right Popliteal Fossa Brown 3 mm flattopped keratotic papules  Left Thigh - Anterior, Right Thigh - Anterior, Scalp Patches of eczematous dermatitis which we will treat with generic fluocinonide applied daily after bathing.  Avoid use on face and body folds.  May try this on the spot on the left inner elbow but I suspect that is a distinct process.       A full examination was performed including scalp, head, eyes, ears, nose, lips, neck, chest, axillae, abdomen, back, buttocks, bilateral upper extremities, bilateral lower extremities, hands, feet, fingers, toes, fingernails, and toenails. All findings within normal limits unless otherwise noted below.  Areas beneath undergarments not fully examined.   Assessment & Plan    AK (actinic keratosis) Chest - Medial (Center)  Destruction of lesion - Chest - Medial (Center) Complexity: simple   Destruction method: cryotherapy   Informed consent: discussed and consent obtained   Timeout:  patient name, date of birth, surgical site, and procedure verified Lesion destroyed using liquid nitrogen: Yes   Cryotherapy cycles:  3 Outcome: patient  tolerated procedure well with no complications   Post-procedure details: wound care instructions given    Skin exam for malignant neoplasm  Observation  Seborrheic keratosis (2) Left Popliteal Fossa; Right Popliteal Fossa  No intervention currently necessary  Dermatitis Left Thigh - Anterior; Right Thigh - Anterior; Scalp  Related Medications fluocinonide (LIDEX) 0.05 % external solution Apply 1 application topically 2 (two) times daily as needed.      I, Lavonna Monarch, MD, have reviewed all documentation for this visit.  The documentation on 03/30/21 for the exam, diagnosis, procedures, and orders are all accurate and complete.

## 2021-04-25 DIAGNOSIS — F331 Major depressive disorder, recurrent, moderate: Secondary | ICD-10-CM | POA: Diagnosis not present

## 2021-04-25 DIAGNOSIS — F411 Generalized anxiety disorder: Secondary | ICD-10-CM | POA: Diagnosis not present

## 2021-05-24 DIAGNOSIS — F411 Generalized anxiety disorder: Secondary | ICD-10-CM | POA: Diagnosis not present

## 2021-05-24 DIAGNOSIS — F331 Major depressive disorder, recurrent, moderate: Secondary | ICD-10-CM | POA: Diagnosis not present

## 2021-06-21 DIAGNOSIS — F411 Generalized anxiety disorder: Secondary | ICD-10-CM | POA: Diagnosis not present

## 2021-06-21 DIAGNOSIS — F331 Major depressive disorder, recurrent, moderate: Secondary | ICD-10-CM | POA: Diagnosis not present

## 2021-07-18 DIAGNOSIS — F411 Generalized anxiety disorder: Secondary | ICD-10-CM | POA: Diagnosis not present

## 2021-07-18 DIAGNOSIS — F331 Major depressive disorder, recurrent, moderate: Secondary | ICD-10-CM | POA: Diagnosis not present

## 2021-09-11 DIAGNOSIS — J449 Chronic obstructive pulmonary disease, unspecified: Secondary | ICD-10-CM | POA: Diagnosis not present

## 2021-09-11 DIAGNOSIS — I10 Essential (primary) hypertension: Secondary | ICD-10-CM | POA: Diagnosis not present

## 2021-09-11 DIAGNOSIS — E039 Hypothyroidism, unspecified: Secondary | ICD-10-CM | POA: Diagnosis not present

## 2021-09-11 DIAGNOSIS — N951 Menopausal and female climacteric states: Secondary | ICD-10-CM | POA: Diagnosis not present

## 2021-09-11 DIAGNOSIS — G2581 Restless legs syndrome: Secondary | ICD-10-CM | POA: Diagnosis not present

## 2021-09-11 DIAGNOSIS — F3341 Major depressive disorder, recurrent, in partial remission: Secondary | ICD-10-CM | POA: Diagnosis not present

## 2021-09-11 DIAGNOSIS — G4700 Insomnia, unspecified: Secondary | ICD-10-CM | POA: Diagnosis not present

## 2021-09-11 DIAGNOSIS — I700 Atherosclerosis of aorta: Secondary | ICD-10-CM | POA: Diagnosis not present

## 2021-09-11 DIAGNOSIS — Z23 Encounter for immunization: Secondary | ICD-10-CM | POA: Diagnosis not present

## 2021-09-11 DIAGNOSIS — D563 Thalassemia minor: Secondary | ICD-10-CM | POA: Diagnosis not present

## 2021-09-11 DIAGNOSIS — Z0000 Encounter for general adult medical examination without abnormal findings: Secondary | ICD-10-CM | POA: Diagnosis not present

## 2021-10-04 DIAGNOSIS — U071 COVID-19: Secondary | ICD-10-CM | POA: Diagnosis not present

## 2021-10-14 DIAGNOSIS — F411 Generalized anxiety disorder: Secondary | ICD-10-CM | POA: Diagnosis not present

## 2021-10-14 DIAGNOSIS — R69 Illness, unspecified: Secondary | ICD-10-CM | POA: Diagnosis not present

## 2021-10-14 DIAGNOSIS — F331 Major depressive disorder, recurrent, moderate: Secondary | ICD-10-CM | POA: Diagnosis not present

## 2021-10-17 ENCOUNTER — Other Ambulatory Visit: Payer: Self-pay | Admitting: Family Medicine

## 2021-10-17 DIAGNOSIS — M8580 Other specified disorders of bone density and structure, unspecified site: Secondary | ICD-10-CM

## 2021-10-22 ENCOUNTER — Other Ambulatory Visit: Payer: Self-pay | Admitting: Obstetrics & Gynecology

## 2021-10-22 DIAGNOSIS — Z1231 Encounter for screening mammogram for malignant neoplasm of breast: Secondary | ICD-10-CM

## 2021-10-31 ENCOUNTER — Other Ambulatory Visit: Payer: Self-pay | Admitting: Advanced Practice Midwife

## 2021-10-31 DIAGNOSIS — M8580 Other specified disorders of bone density and structure, unspecified site: Secondary | ICD-10-CM

## 2021-11-01 DIAGNOSIS — N3020 Other chronic cystitis without hematuria: Secondary | ICD-10-CM | POA: Diagnosis not present

## 2021-11-01 DIAGNOSIS — R350 Frequency of micturition: Secondary | ICD-10-CM | POA: Diagnosis not present

## 2021-11-06 DIAGNOSIS — E039 Hypothyroidism, unspecified: Secondary | ICD-10-CM | POA: Diagnosis not present

## 2021-11-06 DIAGNOSIS — Z01419 Encounter for gynecological examination (general) (routine) without abnormal findings: Secondary | ICD-10-CM | POA: Diagnosis not present

## 2021-11-06 DIAGNOSIS — M8588 Other specified disorders of bone density and structure, other site: Secondary | ICD-10-CM | POA: Diagnosis not present

## 2021-11-06 DIAGNOSIS — N958 Other specified menopausal and perimenopausal disorders: Secondary | ICD-10-CM | POA: Diagnosis not present

## 2021-11-06 DIAGNOSIS — Z124 Encounter for screening for malignant neoplasm of cervix: Secondary | ICD-10-CM | POA: Diagnosis not present

## 2021-11-06 DIAGNOSIS — Z6822 Body mass index (BMI) 22.0-22.9, adult: Secondary | ICD-10-CM | POA: Diagnosis not present

## 2021-12-02 ENCOUNTER — Ambulatory Visit
Admission: RE | Admit: 2021-12-02 | Discharge: 2021-12-02 | Disposition: A | Discharge status: Home / Self Care | Payer: Medicare HMO | Source: Ambulatory Visit | Attending: Obstetrics & Gynecology | Admitting: Obstetrics & Gynecology

## 2021-12-02 ENCOUNTER — Other Ambulatory Visit: Payer: Self-pay

## 2021-12-02 DIAGNOSIS — Z1231 Encounter for screening mammogram for malignant neoplasm of breast: Secondary | ICD-10-CM

## 2021-12-04 ENCOUNTER — Other Ambulatory Visit: Payer: Self-pay | Admitting: Obstetrics & Gynecology

## 2021-12-04 DIAGNOSIS — R928 Other abnormal and inconclusive findings on diagnostic imaging of breast: Secondary | ICD-10-CM

## 2021-12-09 ENCOUNTER — Other Ambulatory Visit: Payer: Self-pay

## 2021-12-09 ENCOUNTER — Ambulatory Visit
Admission: RE | Admit: 2021-12-09 | Discharge: 2021-12-09 | Disposition: A | Discharge status: Home / Self Care | Payer: Medicare HMO | Source: Ambulatory Visit | Attending: Obstetrics & Gynecology | Admitting: Obstetrics & Gynecology

## 2021-12-09 DIAGNOSIS — R922 Inconclusive mammogram: Secondary | ICD-10-CM | POA: Diagnosis not present

## 2021-12-09 DIAGNOSIS — R928 Other abnormal and inconclusive findings on diagnostic imaging of breast: Secondary | ICD-10-CM

## 2021-12-10 DIAGNOSIS — F331 Major depressive disorder, recurrent, moderate: Secondary | ICD-10-CM | POA: Diagnosis not present

## 2021-12-10 DIAGNOSIS — R69 Illness, unspecified: Secondary | ICD-10-CM | POA: Diagnosis not present

## 2021-12-10 DIAGNOSIS — F411 Generalized anxiety disorder: Secondary | ICD-10-CM | POA: Diagnosis not present

## 2022-02-07 DIAGNOSIS — F411 Generalized anxiety disorder: Secondary | ICD-10-CM | POA: Diagnosis not present

## 2022-02-07 DIAGNOSIS — R69 Illness, unspecified: Secondary | ICD-10-CM | POA: Diagnosis not present

## 2022-02-07 DIAGNOSIS — F331 Major depressive disorder, recurrent, moderate: Secondary | ICD-10-CM | POA: Diagnosis not present

## 2022-02-20 ENCOUNTER — Encounter: Payer: Self-pay | Admitting: Physician Assistant

## 2022-02-20 ENCOUNTER — Ambulatory Visit: Payer: Medicare HMO | Admitting: Physician Assistant

## 2022-02-20 DIAGNOSIS — D485 Neoplasm of uncertain behavior of skin: Secondary | ICD-10-CM

## 2022-02-20 DIAGNOSIS — Z1283 Encounter for screening for malignant neoplasm of skin: Secondary | ICD-10-CM | POA: Diagnosis not present

## 2022-02-20 DIAGNOSIS — D0461 Carcinoma in situ of skin of right upper limb, including shoulder: Secondary | ICD-10-CM

## 2022-02-20 DIAGNOSIS — L570 Actinic keratosis: Secondary | ICD-10-CM

## 2022-02-20 DIAGNOSIS — L820 Inflamed seborrheic keratosis: Secondary | ICD-10-CM | POA: Diagnosis not present

## 2022-02-20 DIAGNOSIS — Z85828 Personal history of other malignant neoplasm of skin: Secondary | ICD-10-CM

## 2022-02-20 NOTE — Patient Instructions (Signed)

## 2022-02-20 NOTE — Progress Notes (Signed)
   Follow-Up Visit   Subjective  Cheryl Burgess is a 66 y.o. female who presents for the following: Annual Exam (Here for annual skin exam. Concerns side of nose can really only feel it. Behind knees and upper left thigh. History of non mole skin cancer. ).   The following portions of the chart were reviewed this encounter and updated as appropriate:  Tobacco  Meds  Problems  Med Hx  Surg Hx  Fam Hx      Objective  Well appearing patient in no apparent distress; mood and affect are within normal limits.  A full examination was performed including scalp, head, eyes, ears, nose, lips, neck, chest, axillae, abdomen, back, buttocks, bilateral upper extremities, bilateral lower extremities, hands, feet, fingers, toes, fingernails, and toenails. All findings within normal limits unless otherwise noted below.  No signs of non-mole skin cancer. No atypical nevi noted at the time of the visit.   Right Upper Arm - Anterior Pink plaque with xerosis and central clearing.        Mid Forehead, left side of the nose Erythematous patches with gritty scale.  Right Shoulder - Anterior (3) Stuck-on, waxy, tan-brown papules and plaques on an erythematous base.    Assessment & Plan  Encounter for screening for malignant neoplasm of skin  Yearly skin examination  Neoplasm of uncertain behavior of skin Right Upper Arm - Anterior  Skin / nail biopsy Type of biopsy: tangential   Informed consent: discussed and consent obtained   Timeout: patient name, date of birth, surgical site, and procedure verified   Procedure prep:  Patient was prepped and draped in usual sterile fashion (Non sterile) Prep type:  Chlorhexidine Anesthesia: the lesion was anesthetized in a standard fashion   Anesthetic:  1% lidocaine w/ epinephrine 1-100,000 local infiltration Instrument used: flexible razor blade   Outcome: patient tolerated procedure well   Post-procedure details: wound care instructions given     Specimen 1 - Surgical pathology Differential Diagnosis: bcc vs scc, GA  Check Margins: No  AK (actinic keratosis) Mid Forehead, left side of the nose  Destruction of lesion - Mid Forehead, left side of the nose Complexity: simple   Destruction method: cryotherapy   Informed consent: discussed and consent obtained   Timeout:  patient name, date of birth, surgical site, and procedure verified Lesion destroyed using liquid nitrogen: Yes   Cryotherapy cycles:  3 Outcome: patient tolerated procedure well with no complications    Seborrheic keratosis, inflamed (3) Right Shoulder - Anterior  Destruction of lesion - Right Shoulder - Anterior Complexity: simple   Destruction method: cryotherapy   Informed consent: discussed and consent obtained   Timeout:  patient name, date of birth, surgical site, and procedure verified Lesion destroyed using liquid nitrogen: Yes   Cryotherapy cycles:  3 Outcome: patient tolerated procedure well with no complications      I, Cleveland Yarbro, PA-C, have reviewed all documentation's for this visit.  The documentation on 02/20/22 for the exam, diagnosis, procedures and orders are all accurate and complete.

## 2022-02-24 ENCOUNTER — Telehealth: Payer: Self-pay | Admitting: *Deleted

## 2022-02-24 NOTE — Telephone Encounter (Signed)
Pathology to patient-surgery appointment scheduled.  

## 2022-02-24 NOTE — Telephone Encounter (Signed)
-----   Message from Warren Danes, [place] sent at 02/24/2022 11:09 AM EDT ----- 30

## 2022-03-07 DIAGNOSIS — R0781 Pleurodynia: Secondary | ICD-10-CM | POA: Diagnosis not present

## 2022-03-25 ENCOUNTER — Ambulatory Visit: Payer: Medicare Other | Admitting: Physician Assistant

## 2022-03-28 DIAGNOSIS — R69 Illness, unspecified: Secondary | ICD-10-CM | POA: Diagnosis not present

## 2022-03-28 DIAGNOSIS — Z008 Encounter for other general examination: Secondary | ICD-10-CM | POA: Diagnosis not present

## 2022-03-28 DIAGNOSIS — Z7982 Long term (current) use of aspirin: Secondary | ICD-10-CM | POA: Diagnosis not present

## 2022-03-28 DIAGNOSIS — G4700 Insomnia, unspecified: Secondary | ICD-10-CM | POA: Diagnosis not present

## 2022-03-28 DIAGNOSIS — G2581 Restless legs syndrome: Secondary | ICD-10-CM | POA: Diagnosis not present

## 2022-03-28 DIAGNOSIS — K219 Gastro-esophageal reflux disease without esophagitis: Secondary | ICD-10-CM | POA: Diagnosis not present

## 2022-03-28 DIAGNOSIS — K581 Irritable bowel syndrome with constipation: Secondary | ICD-10-CM | POA: Diagnosis not present

## 2022-03-28 DIAGNOSIS — Z820 Family history of epilepsy and other diseases of the nervous system: Secondary | ICD-10-CM | POA: Diagnosis not present

## 2022-03-28 DIAGNOSIS — N390 Urinary tract infection, site not specified: Secondary | ICD-10-CM | POA: Diagnosis not present

## 2022-03-28 DIAGNOSIS — I10 Essential (primary) hypertension: Secondary | ICD-10-CM | POA: Diagnosis not present

## 2022-03-28 DIAGNOSIS — F411 Generalized anxiety disorder: Secondary | ICD-10-CM | POA: Diagnosis not present

## 2022-03-28 DIAGNOSIS — R32 Unspecified urinary incontinence: Secondary | ICD-10-CM | POA: Diagnosis not present

## 2022-03-28 DIAGNOSIS — E039 Hypothyroidism, unspecified: Secondary | ICD-10-CM | POA: Diagnosis not present

## 2022-04-01 DIAGNOSIS — F331 Major depressive disorder, recurrent, moderate: Secondary | ICD-10-CM | POA: Diagnosis not present

## 2022-04-01 DIAGNOSIS — R69 Illness, unspecified: Secondary | ICD-10-CM | POA: Diagnosis not present

## 2022-04-01 DIAGNOSIS — F411 Generalized anxiety disorder: Secondary | ICD-10-CM | POA: Diagnosis not present

## 2022-05-05 ENCOUNTER — Encounter: Payer: Self-pay | Admitting: Physician Assistant

## 2022-05-08 ENCOUNTER — Other Ambulatory Visit: Payer: Self-pay | Admitting: Family Medicine

## 2022-05-08 DIAGNOSIS — I739 Peripheral vascular disease, unspecified: Secondary | ICD-10-CM

## 2022-05-12 ENCOUNTER — Ambulatory Visit
Admission: RE | Admit: 2022-05-12 | Discharge: 2022-05-12 | Disposition: A | Discharge status: Home / Self Care | Payer: PPO | Source: Ambulatory Visit | Attending: Family Medicine | Admitting: Family Medicine

## 2022-05-12 DIAGNOSIS — I739 Peripheral vascular disease, unspecified: Secondary | ICD-10-CM

## 2022-05-21 ENCOUNTER — Ambulatory Visit: Payer: Medicare HMO | Admitting: Physician Assistant

## 2022-05-30 DIAGNOSIS — F411 Generalized anxiety disorder: Secondary | ICD-10-CM | POA: Diagnosis not present

## 2022-05-30 DIAGNOSIS — F331 Major depressive disorder, recurrent, moderate: Secondary | ICD-10-CM | POA: Diagnosis not present

## 2022-07-30 DIAGNOSIS — F331 Major depressive disorder, recurrent, moderate: Secondary | ICD-10-CM | POA: Diagnosis not present

## 2022-07-30 DIAGNOSIS — F411 Generalized anxiety disorder: Secondary | ICD-10-CM | POA: Diagnosis not present

## 2022-09-25 DIAGNOSIS — N951 Menopausal and female climacteric states: Secondary | ICD-10-CM | POA: Diagnosis not present

## 2022-09-25 DIAGNOSIS — J449 Chronic obstructive pulmonary disease, unspecified: Secondary | ICD-10-CM | POA: Diagnosis not present

## 2022-09-25 DIAGNOSIS — E039 Hypothyroidism, unspecified: Secondary | ICD-10-CM | POA: Diagnosis not present

## 2022-09-25 DIAGNOSIS — Z0000 Encounter for general adult medical examination without abnormal findings: Secondary | ICD-10-CM | POA: Diagnosis not present

## 2022-09-25 DIAGNOSIS — G2581 Restless legs syndrome: Secondary | ICD-10-CM | POA: Diagnosis not present

## 2022-09-25 DIAGNOSIS — G4700 Insomnia, unspecified: Secondary | ICD-10-CM | POA: Diagnosis not present

## 2022-09-25 DIAGNOSIS — D563 Thalassemia minor: Secondary | ICD-10-CM | POA: Diagnosis not present

## 2022-09-25 DIAGNOSIS — I10 Essential (primary) hypertension: Secondary | ICD-10-CM | POA: Diagnosis not present

## 2022-09-25 DIAGNOSIS — I700 Atherosclerosis of aorta: Secondary | ICD-10-CM | POA: Diagnosis not present

## 2022-09-25 DIAGNOSIS — F3341 Major depressive disorder, recurrent, in partial remission: Secondary | ICD-10-CM | POA: Diagnosis not present

## 2022-09-25 DIAGNOSIS — Z23 Encounter for immunization: Secondary | ICD-10-CM | POA: Diagnosis not present

## 2022-10-03 DIAGNOSIS — E871 Hypo-osmolality and hyponatremia: Secondary | ICD-10-CM | POA: Diagnosis not present

## 2022-11-10 DIAGNOSIS — M810 Age-related osteoporosis without current pathological fracture: Secondary | ICD-10-CM | POA: Diagnosis not present

## 2022-11-10 DIAGNOSIS — Z01419 Encounter for gynecological examination (general) (routine) without abnormal findings: Secondary | ICD-10-CM | POA: Diagnosis not present

## 2022-11-10 DIAGNOSIS — Z6822 Body mass index (BMI) 22.0-22.9, adult: Secondary | ICD-10-CM | POA: Diagnosis not present

## 2022-11-12 DIAGNOSIS — M810 Age-related osteoporosis without current pathological fracture: Secondary | ICD-10-CM | POA: Diagnosis not present

## 2023-01-05 ENCOUNTER — Other Ambulatory Visit: Payer: Self-pay | Admitting: Obstetrics and Gynecology

## 2023-01-05 DIAGNOSIS — Z0000 Encounter for general adult medical examination without abnormal findings: Secondary | ICD-10-CM

## 2023-01-08 ENCOUNTER — Ambulatory Visit
Admission: RE | Admit: 2023-01-08 | Discharge: 2023-01-08 | Disposition: A | Discharge status: Home / Self Care | Payer: Medicare HMO | Source: Ambulatory Visit | Attending: Obstetrics and Gynecology | Admitting: Obstetrics and Gynecology

## 2023-01-08 DIAGNOSIS — Z0000 Encounter for general adult medical examination without abnormal findings: Secondary | ICD-10-CM

## 2023-01-08 DIAGNOSIS — Z1231 Encounter for screening mammogram for malignant neoplasm of breast: Secondary | ICD-10-CM | POA: Diagnosis not present

## 2023-01-09 DIAGNOSIS — F331 Major depressive disorder, recurrent, moderate: Secondary | ICD-10-CM | POA: Diagnosis not present

## 2023-01-09 DIAGNOSIS — F411 Generalized anxiety disorder: Secondary | ICD-10-CM | POA: Diagnosis not present

## 2023-01-15 ENCOUNTER — Ambulatory Visit: Payer: PPO | Admitting: Dermatology

## 2023-01-20 DIAGNOSIS — R140 Abdominal distension (gaseous): Secondary | ICD-10-CM | POA: Diagnosis not present

## 2023-01-20 DIAGNOSIS — K31A19 Gastric intestinal metaplasia without dysplasia, unspecified site: Secondary | ICD-10-CM | POA: Diagnosis not present

## 2023-01-20 DIAGNOSIS — K317 Polyp of stomach and duodenum: Secondary | ICD-10-CM | POA: Diagnosis not present

## 2023-01-20 DIAGNOSIS — K2930 Chronic superficial gastritis without bleeding: Secondary | ICD-10-CM | POA: Diagnosis not present

## 2023-01-20 DIAGNOSIS — K3189 Other diseases of stomach and duodenum: Secondary | ICD-10-CM | POA: Diagnosis not present

## 2023-01-20 DIAGNOSIS — K2940 Chronic atrophic gastritis without bleeding: Secondary | ICD-10-CM | POA: Diagnosis not present

## 2023-01-26 DIAGNOSIS — K2930 Chronic superficial gastritis without bleeding: Secondary | ICD-10-CM | POA: Diagnosis not present

## 2023-01-26 DIAGNOSIS — K31A19 Gastric intestinal metaplasia without dysplasia, unspecified site: Secondary | ICD-10-CM | POA: Diagnosis not present

## 2023-01-26 DIAGNOSIS — K2940 Chronic atrophic gastritis without bleeding: Secondary | ICD-10-CM | POA: Diagnosis not present

## 2023-02-19 ENCOUNTER — Ambulatory Visit: Payer: Medicare HMO | Admitting: Dermatology

## 2023-02-19 ENCOUNTER — Encounter: Payer: Self-pay | Admitting: Dermatology

## 2023-02-19 VITALS — BP 120/78

## 2023-02-19 DIAGNOSIS — Z1283 Encounter for screening for malignant neoplasm of skin: Secondary | ICD-10-CM | POA: Diagnosis not present

## 2023-02-19 DIAGNOSIS — D229 Melanocytic nevi, unspecified: Secondary | ICD-10-CM

## 2023-02-19 DIAGNOSIS — L578 Other skin changes due to chronic exposure to nonionizing radiation: Secondary | ICD-10-CM

## 2023-02-19 DIAGNOSIS — L918 Other hypertrophic disorders of the skin: Secondary | ICD-10-CM

## 2023-02-19 DIAGNOSIS — L570 Actinic keratosis: Secondary | ICD-10-CM

## 2023-02-19 DIAGNOSIS — L821 Other seborrheic keratosis: Secondary | ICD-10-CM | POA: Diagnosis not present

## 2023-02-19 DIAGNOSIS — X32XXXA Exposure to sunlight, initial encounter: Secondary | ICD-10-CM

## 2023-02-19 DIAGNOSIS — L820 Inflamed seborrheic keratosis: Secondary | ICD-10-CM | POA: Diagnosis not present

## 2023-02-19 DIAGNOSIS — D1801 Hemangioma of skin and subcutaneous tissue: Secondary | ICD-10-CM | POA: Diagnosis not present

## 2023-02-19 DIAGNOSIS — L219 Seborrheic dermatitis, unspecified: Secondary | ICD-10-CM

## 2023-02-19 DIAGNOSIS — C44519 Basal cell carcinoma of skin of other part of trunk: Secondary | ICD-10-CM

## 2023-02-19 DIAGNOSIS — W908XXA Exposure to other nonionizing radiation, initial encounter: Secondary | ICD-10-CM | POA: Diagnosis not present

## 2023-02-19 DIAGNOSIS — Z86007 Personal history of in-situ neoplasm of skin: Secondary | ICD-10-CM

## 2023-02-19 DIAGNOSIS — D492 Neoplasm of unspecified behavior of bone, soft tissue, and skin: Secondary | ICD-10-CM

## 2023-02-19 DIAGNOSIS — L814 Other melanin hyperpigmentation: Secondary | ICD-10-CM

## 2023-02-19 MED ORDER — CLOBETASOL PROPIONATE 0.05 % EX SOLN: 1.0000 | Freq: Two times a day (BID) | CUTANEOUS | 0 refills | Status: DC

## 2023-02-19 NOTE — Patient Instructions (Addendum)
Due to recent changes in healthcare laws, you may see results of your pathology and/or laboratory studies on MyChart before the doctors have had a chance to review them. We understand that in some cases there may be results that are confusing or concerning to you. Please understand that not all results are received at the same time and often the doctors may need to interpret multiple results in order to provide you with the best plan of care or course of treatment. Therefore, we ask that you please give us 2 business days to thoroughly review all your results before contacting the office for clarification. Should we see a critical lab result, you will be contacted sooner.   If You Need Anything After Your Visit  If you have any questions or concerns for your doctor, please call our main line at [phone] If no one answers, please leave a voicemail as directed and we will return your call as soon as possible. Messages left after 4 pm will be answered the following business day.   You may also send us a message via MyChart. We typically respond to MyChart messages within 1-2 business days.  For prescription refills, please ask your pharmacy to contact our office. Our fax number is [phone].  If you have an urgent issue when the clinic is closed that cannot wait until the next business day, you can page your doctor at the number below.    Please note that while we do our best to be available for urgent issues outside of office hours, we are not available 24/7.   If you have an urgent issue and are unable to reach us, you may choose to seek medical care at your doctor's office, retail clinic, urgent care center, or emergency room.  If you have a medical emergency, please immediately call 911 or go to the emergency department. In the event of inclement weather, please call our main line at [phone] for an update on the status of any delays or closures.  Dermatology Medication Tips: Please  keep the boxes that topical medications come in in order to help keep track of the instructions about where and how to use these. Pharmacies typically print the medication instructions only on the boxes and not directly on the medication tubes.   If your medication is too expensive, please contact our office at [phone] or send us a message through MyChart.   We are unable to tell what your co-pay for medications will be in advance as this is different depending on your insurance coverage. However, we may be able to find a substitute medication at lower cost or fill out paperwork to get insurance to cover a needed medication.   If a prior authorization is required to get your medication covered by your insurance company, please allow us 1-2 business days to complete this process.  Drug prices often vary depending on where the prescription is filled and some pharmacies may offer cheaper prices.  The website www.goodrx.com contains coupons for medications through different pharmacies. The prices here do not account for what the cost may be with help from insurance (it may be cheaper with your insurance), but the website can give you the price if you did not use any insurance.  - You can print the associated coupon and take it with your prescription to the pharmacy.  - You may also stop by our office during regular business hours and pick up a GoodRx coupon card.  - If you need your   prescription sent electronically to a different pharmacy, notify our office through [facility] MyChart or by phone at [phone]    Skin Education :   I counseled the patient regarding the following: Sun screen (SPF 30 or greater) should be applied during peak UV exposure (between 10am and 2pm) and reapplied after exercise or swimming.  The ABCDEs of melanoma were reviewed with the patient, and the importance of monthly self-examination of moles was emphasized. Should any moles change in shape or color, or itch,  bleed or burn, pt will contact our office for evaluation sooner then their interval appointment.  Plan: Sunscreen Recommendations I recommended a broad spectrum sunscreen with a SPF of 30 or higher. I explained that SPF 30 sunscreens block approximately 97 percent of the sun's harmful rays. Sunscreens should be applied at least 15 minutes prior to expected sun exposure and then every 2 hours after that as long as sun exposure continues. If swimming or exercising sunscreen should be reapplied every 45 minutes to an hour after getting wet or sweating. One ounce, or the equivalent of a shot glass full of sunscreen, is adequate to protect the skin not covered by a bathing suit. I also recommended a lip balm with a sunscreen as well. Sun protective clothing can be used in lieu of sunscreen but must be worn the entire time you are exposed to the sun's rays. Cryotherapy Aftercare  Wash gently with soap and water everyday.   Apply Vaseline and Band-Aid daily until healed. Patient Handout: Wound Care for Skin Biopsy Site  Patient Handout: Wound Care for Skin Biopsy Site  Taking Care of Your Skin Biopsy Site  Proper care of the biopsy site is essential for promoting healing and minimizing scarring. This handout provides instructions on how to care for your biopsy site to ensure optimal recovery.  1. Cleaning the Wound:  Clean the biopsy site daily with gentle soap and water. Gently pat the area dry with a clean, soft towel. Avoid harsh scrubbing or rubbing the area, as this can irritate the skin and delay healing.  2. Applying Aquaphor and Bandage:  After cleaning the wound, apply a thin layer of Aquaphor ointment to the biopsy site. Cover the area with a sterile bandage to protect it from dirt, bacteria, and friction. Change the bandage daily or as needed if it becomes soiled or wet.  3. Continued Care for One Week:  Repeat the cleaning, Aquaphor application, and bandaging process daily for one  week following the biopsy procedure. Keeping the wound clean and moist during this initial healing period will help prevent infection and promote optimal healing.  4. Massaging Aquaphor into the Area:  ---After one week, discontinue the use of bandages but continue to apply Aquaphor to the biopsy site. ----Gently massage the Aquaphor into the area using circular motions. ---Massaging the skin helps to promote circulation and prevent the formation of scar tissue.   Additional Tips:  Avoid exposing the biopsy site to direct sunlight during the healing process, as this can cause hyperpigmentation or worsen scarring. If you experience any signs of infection, such as increased redness, swelling, warmth, or drainage from the wound, contact your healthcare provider immediately. Follow any additional instructions provided by your healthcare provider for caring for the biopsy site and managing any discomfort. Conclusion:  Taking proper care of your skin biopsy site is crucial for ensuring optimal healing and minimizing scarring. By following these instructions for cleaning, applying Aquaphor, and massaging the area, you can promote   a smooth and successful recovery. If you have any questions or concerns about caring for your biopsy site, don't hesitate to contact your healthcare provider for guidance.  I counseled the patient regarding the following: Skin Care: Actinic Damage can improve with broad spectrum sunscreen, sun avoidance, bleaching creams, retinoids, chemical peels and laser. Expectations: Actinic Damage is photo-aging from excessive sun exposure. It manifests as unwanted pigmentation, wrinkles and textural thinning of the skin.  I recommended the following: Broad Spectrum Sunscreen SPF 30+ - SPF 30 daily to face, neck, chest and hands, reapplying every 3 hours when outside for long periods of time  

## 2023-02-19 NOTE — Progress Notes (Signed)
New Patient Visit   Subjective  Cheryl Burgess is a 67 y.o. female who presents for the following: Skin Cancer Screening and Full Body Skin Exam  The patient presents for Total-Body Skin Exam (TBSE) for skin cancer screening and mole check. The patient has spots, moles and lesions to be evaluated, some may be new or changing and the patient has concerns that these could be cancer.  Prior patient of Dr. Jorja Loa. She had a skin exam 1 year ago. 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 She has had AK's treated with LN2.  The following portions of the chart were reviewed this encounter and updated as appropriate: medications, allergies, medical history  Review of Systems:  No other skin or systemic complaints except as noted in HPI or Assessment and Plan.  Objective  Well appearing patient in no apparent distress; mood and affect are within normal limits.  A full examination was performed including scalp, head, eyes, ears, nose, lips, neck, chest, axillae, abdomen, back, buttocks, bilateral upper extremities, bilateral lower extremities, hands, feet, fingers, toes, fingernails, and toenails. All findings within normal limits unless otherwise noted below.   Relevant physical exam findings are noted in the Assessment and Plan.  Chest (8) Erythematous scaly papules  Left Forearm - Anterior, Right Forearm - Anterior, Right Lower Leg - Posterior Inflames waxy brown papules  Right Upper Back 6mm pink crusted papule         Assessment & Plan   LENTIGINES, SEBORRHEIC KERATOSES, HEMANGIOMAS - Benign normal skin lesions - Benign-appearing - Call for any changes  MELANOCYTIC NEVI - Tan-brown and/or pink-flesh-colored symmetric macules and papules - Benign appearing on exam today - Observation - Call clinic for new or changing moles - Recommend daily use of broad spectrum spf 30+ sunscreen to sun-exposed areas.   ACTINIC DAMAGE - Chronic  condition, secondary to cumulative UV/sun exposure - diffuse scaly erythematous macules with underlying dyspigmentation - Recommend daily broad spectrum sunscreen SPF 30+ to sun-exposed areas, reapply every 2 hours as needed.  - Staying in the shade or wearing long sleeves, sun glasses (UVA+UVB protection) and wide brim hats (4-inch brim around the entire circumference of the hat) are also recommended for sun protection.  - Call for new or changing lesions.  SEBORRHEIC DERMATITIS Exam: Pink patches with greasy scale at posterior scalp  Not at goal  Seborrheic Dermatitis is a chronic persistent rash characterized by pinkness and scaling most commonly of the mid face but also can occur on the scalp (dandruff), ears; mid chest, mid back and groin.  It tends to be exacerbated by stress and cooler weather.  People who have neurologic disease may experience new onset or exacerbation of existing seborrheic dermatitis.  The condition is not curable but treatable and can be controlled.  Treatment Plan: Clobetasol solution 2 x daily for 2 weeks then stop   SKIN CANCER SCREENING PERFORMED TODAY.    Actinic keratoses (8) Chest  Destruction of lesion - Chest Complexity: simple   Destruction method: cryotherapy   Informed consent: discussed and consent obtained   Timeout:  patient name, date of birth, surgical site, and procedure verified Lesion destroyed using liquid nitrogen: Yes   Region frozen until ice ball extended beyond lesion: Yes   Outcome: patient tolerated procedure well with no complications   Post-procedure details: wound care instructions given    Inflamed seborrheic keratosis (3) Left Forearm - Anterior; Right Forearm - Anterior; Right  Lower Leg - Posterior  Destruction of lesion - Left Forearm - Anterior, Right Forearm - Anterior, Right Lower Leg - Posterior Complexity: simple   Destruction method: cryotherapy   Informed consent: discussed and consent obtained   Timeout:   patient name, date of birth, surgical site, and procedure verified Lesion destroyed using liquid nitrogen: Yes   Outcome: patient tolerated procedure well with no complications   Post-procedure details: wound care instructions given    Neoplasm of skin Right Upper Back  Skin / nail biopsy Type of biopsy: tangential   Informed consent: discussed and consent obtained   Timeout: patient name, date of birth, surgical site, and procedure verified   Procedure prep:  Patient was prepped and draped in usual sterile fashion Prep type:  Isopropyl alcohol Anesthesia: the lesion was anesthetized in a standard fashion   Anesthetic:  1% lidocaine w/ epinephrine 1-100,000 buffered w/ 8.4% NaHCO3 Instrument used: DermaBlade   Hemostasis achieved with: aluminum chloride   Outcome: patient tolerated procedure well   Post-procedure details: sterile dressing applied and wound care instructions given   Dressing type: petrolatum gauze and bandage    Specimen 1 - Surgical pathology Differential Diagnosis: BCC vs SCC  Check Margins: No  Inflamed skin tag Left Lower Back    Return in about 6 months (around 08/22/2023) for AK Follow Up, TBSE in 1 year.  Jaclynn Guarneri, CMA, am acting as scribe for [employer], DO.   Documentation: I have reviewed the above documentation for accuracy and completeness, and I agree with the above.  Langston Reusing, DO

## 2023-02-24 NOTE — Progress Notes (Signed)
Hi Cheryl Burgess,  Please call Cheryl Burgess and notify their bx results were positive for a skin CA that will be excised by Dr. Onalee Hua  Please schedule 30 min surgery  Diagnosis Skin , right upper back BASAL CELL CARCINOMA, NODULAR PATTERN Microscopic Description There are dermal nodular aggregates of atypical basaloid cells with peripheral palisading. This is a nodular basal cell carcinoma.

## 2023-02-25 ENCOUNTER — Telehealth: Payer: Self-pay

## 2023-02-25 NOTE — Telephone Encounter (Signed)
-----   Message from Terri Piedra, DO sent at 02/24/2023  9:37 AM EDT ----- Forde Dandy,  Please call pt and notify their bx results were positive for a skin CA that will be excised by Dr. Onalee Hua  Please schedule 30 min surgery  Diagnosis Skin , right upper back BASAL CELL CARCINOMA, NODULAR PATTERN Microscopic Description There are dermal nodular aggregates of atypical basaloid cells with peripheral palisading. This is a nodular basal cell carcinoma.

## 2023-02-26 ENCOUNTER — Telehealth: Payer: Self-pay

## 2023-02-26 NOTE — Telephone Encounter (Signed)
-----   Message from Jennifer N David, DO sent at 02/24/2023  9:37 AM EDT ----- Hi Shakisha Abend,  Please call pt and notify their bx results were positive for a skin CA that will be excised by Dr. David  Please schedule 30 min surgery  Diagnosis Skin , right upper back BASAL CELL CARCINOMA, NODULAR PATTERN Microscopic Description There are dermal nodular aggregates of atypical basaloid cells with peripheral palisading. This is a nodular basal cell carcinoma. 

## 2023-02-26 NOTE — Telephone Encounter (Signed)
-----   Message from Jennifer N David, DO sent at 02/24/2023  9:37 AM EDT ----- Hi Irving Lubbers,  Please call pt and notify their bx results were positive for a skin CA that will be excised by Dr. David  Please schedule 30 min surgery  Diagnosis Skin , right upper back BASAL CELL CARCINOMA, NODULAR PATTERN Microscopic Description There are dermal nodular aggregates of atypical basaloid cells with peripheral palisading. This is a nodular basal cell carcinoma. 

## 2023-03-11 DIAGNOSIS — F411 Generalized anxiety disorder: Secondary | ICD-10-CM | POA: Diagnosis not present

## 2023-03-11 DIAGNOSIS — F331 Major depressive disorder, recurrent, moderate: Secondary | ICD-10-CM | POA: Diagnosis not present

## 2023-03-12 ENCOUNTER — Ambulatory Visit (INDEPENDENT_AMBULATORY_CARE_PROVIDER_SITE_OTHER): Payer: Medicare HMO | Admitting: Dermatology

## 2023-03-12 ENCOUNTER — Encounter: Payer: Self-pay | Admitting: Dermatology

## 2023-03-12 DIAGNOSIS — C44519 Basal cell carcinoma of skin of other part of trunk: Secondary | ICD-10-CM | POA: Diagnosis not present

## 2023-03-12 DIAGNOSIS — L988 Other specified disorders of the skin and subcutaneous tissue: Secondary | ICD-10-CM | POA: Diagnosis not present

## 2023-03-12 MED ORDER — MUPIROCIN 2 % EX OINT: 1.0000 | TOPICAL_OINTMENT | Freq: Two times a day (BID) | CUTANEOUS | 0 refills | Status: DC

## 2023-03-12 MED ORDER — DOXYCYCLINE HYCLATE 100 MG PO TABS: 100.0000 mg | ORAL_TABLET | Freq: Two times a day (BID) | ORAL | 0 refills | Status: AC

## 2023-03-12 NOTE — Patient Instructions (Signed)
AFTER- CARE OF SURGERY SITE 1. Leave the current dressing on for 18-24 hours and do not get it wet during that time. 2. After 24 hours wash site normally with mild soap/cleanser. 3. Apply a small amount of Mupirocin Ointment (This is a prescription sent to your local pharmacy) to the area. 4. Cover with a bandage using a non stick gauze pad and paper tape or Band Aid(s). 5. Repeat this twice each day for about 7 days, or as instructed by your provider. 6. Take the 100mg  Doxycyline every morning and evening with a bid meal for 5 days.  Should bleeding occur, apply pressure for 10-15 minutes to the site. If bleeding continues, apply pressure again  for 10-15 minutes. If bleeding persists despite applying consistent pressure, call the office or go to the nearest emergency room.  Call the office for any questions or concerns. NOTE: Pathology results will be reviewed at the time the sutures (stitches) are removed or you will be notified with  the results (which usually takes 7 days)  Supplies for surgical site care: 1. Non stick gauze pads and Band Aids 2. Mild soap/cleanser ([address] Lever 2000, Cetaphil, etc.) 3. Paper tape 4. Prescription Mupirocin Ointment or  Aquaphor healing ointment if you're waiting for your prescription.   Doxycycline should be taken with food to prevent nausea. Do not lay down for 30 minutes after taking. Be cautious with sun exposure and use good sun protection while on this medication. Pregnant women should not take this medication.    Due to recent changes in healthcare laws, you may see results of your pathology and/or laboratory studies on MyChart before the doctors have had a chance to review them. We understand that in some cases there may be results that are confusing or concerning to you. Please understand that not all results are received at the same time and often the doctors may need to interpret multiple  results in order to provide you with the best plan of care or course of treatment. Therefore, we ask that you please give [place] 2 business days to thoroughly review all your results before contacting the office for clarification. Should we see a critical lab result, you will be contacted sooner.   If You Need Anything After Your Visit  If you have any questions or concerns for your doctor, please call our main line at [phone] If no one answers, please leave a voicemail as directed and we will return your call as soon as possible. Messages left after 4 pm will be answered the following business day.   You may also send [place] a message via MyChart. We typically respond to MyChart messages within 1-2 business days.  For prescription refills, please ask your pharmacy to contact our office. Our fax number is [phone].  If you have an urgent issue when the clinic is closed that cannot wait until the next business day, you can page your doctor at the number below.    Please note that while we do our best to be available for urgent issues outside of office hours, we are not available 24/7.   If you have an urgent issue and are unable to reach [place], you may choose to seek medical care at your doctor's office, retail clinic, urgent care center, or emergency room.  If you have a medical emergency, please immediately call 911 or go to the emergency department. In the event of inclement weather, please call our main line at [phone] for an update on  the status of any delays or closures.  Dermatology Medication Tips: Please keep the boxes that topical medications come in in order to help keep track of the instructions about where and how to use these. Pharmacies typically print the medication instructions only on the boxes and not directly on the medication tubes.   If your medication is too expensive, please contact our office at [phone] or send [place] a message through MyChart.   We are unable to  tell what your co-pay for medications will be in advance as this is different depending on your insurance coverage. However, we may be able to find a substitute medication at lower cost or fill out paperwork to get insurance to cover a needed medication.   If a prior authorization is required to get your medication covered by your insurance company, please allow [place] 1-2 business days to complete this process.  Drug prices often vary depending on where the prescription is filled and some pharmacies may offer cheaper prices.  The website www.goodrx.com contains coupons for medications through different pharmacies. The prices here do not account for what the cost may be with help from insurance (it may be cheaper with your insurance), but the website can give you the price if you did not use any insurance.  - You can print the associated coupon and take it with your prescription to the pharmacy.  - You may also stop by our office during regular business hours and pick up a GoodRx coupon card.  - If you need your prescription sent electronically to a different pharmacy, notify our office through [facility] or by phone at [phone]

## 2023-03-12 NOTE — Progress Notes (Signed)
   Follow-Up Visit   Subjective  Cheryl Burgess is a 67 y.o. female who presents for the following: Excision of nodular BCC at the right upper back  The following portions of the chart were reviewed this encounter and updated as appropriate: medications, allergies, medical history  Review of Systems:  No other skin or systemic complaints except as noted in HPI or Assessment and Plan.  Objective  Well appearing patient in no apparent distress; mood and affect are within normal limits.  A focused examination was performed of the following areas: Right upper back  Relevant physical exam findings are noted in the Assessment and Plan.   Right Upper Back Biopsy proven BCC. 6mm pink patch    Assessment & Plan   BCC (basal cell carcinoma), trunk Right Upper Back  Skin excision  Lesion length (cm):  0.6 Margin per side (cm):  0.3 Total excision diameter (cm):  1.2 Timeout: patient name, date of birth, surgical site, and procedure verified   Procedure prep:  Patient was prepped and draped in usual sterile fashion Prep type:  Chlorhexidine Anesthesia: the lesion was anesthetized in a standard fashion   Anesthetic:  1% lidocaine w/ epinephrine 1-100,000 buffered w/ 8.4% NaHCO3 Instrument used: #15 blade   Hemostasis achieved with: electrodesiccation   Outcome: patient tolerated procedure well with no complications    Skin repair Complexity:  Complex Final length (cm):  3.5 Informed consent: discussed and consent obtained   Timeout: patient name, date of birth, surgical site, and procedure verified   Procedure prep:  Patient was prepped and draped in usual sterile fashion Prep type:  Povidone-iodine (Alcohol) Anesthesia: the lesion was anesthetized in a standard fashion   Anesthetic:  1% lidocaine w/ epinephrine 1-100,000 buffered w/ 8.4% NaHCO3 Reason for type of repair: reduce tension to allow closure, reduce the risk of dehiscence, infection, and necrosis, reduce subcutaneous  dead space and avoid a hematoma, allow closure of the large defect, preserve normal anatomy, preserve normal anatomical and functional relationships and enhance both functionality and cosmetic results   Undermining: edges undermined   Undermining comment:  1 cm Subcutaneous layers (deep stitches):  Suture size:  3-0 Suture type: Vicryl (polyglactin 910)   Subcutaneous suture technique: Inverted dermal. Fine/surface layer approximation (top stitches):  Suture size:  3-0 Suture type: nylon   Suture type comment:  Nylon Stitches: horizontal mattress   Suture removal (days):  14 Hemostasis achieved with: suture, pressure and electrodesiccation Hemostasis achieved with comment:  Electrocautery Outcome: patient tolerated procedure well with no complications   Post-procedure details: sterile dressing applied and wound care instructions given   Dressing type: pressure dressing   Additional details:  Undermining Defect measured 1 cm  Specimen 1 - Surgical pathology Differential Diagnosis: Nodular BCC         Accession # [id number]  Check Margins: yes     Return in about 2 weeks (around 03/26/2023) for Suture Removal.  I, Mosetta Anis, CMA, am acting as scribe for [employer], DO.   Documentation: I have reviewed the above documentation for accuracy and completeness, and I agree with the above.  Langston Reusing, DO

## 2023-03-26 ENCOUNTER — Ambulatory Visit (INDEPENDENT_AMBULATORY_CARE_PROVIDER_SITE_OTHER): Payer: PPO | Admitting: Dermatology

## 2023-03-26 DIAGNOSIS — Z4802 Encounter for removal of sutures: Secondary | ICD-10-CM

## 2023-03-26 NOTE — Progress Notes (Signed)
   Follow-Up Visit   Subjective  Cheryl Burgess is a 67 y.o. female who presents for the following: Post-op (Excision) Suture Removal  Patient present today for post-op follow up visit for suture Removal. Patient was last evaluated on 03/12/23 for excision of BCC.   The following portions of the chart were reviewed this encounter and updated as appropriate: medications, allergies, medical history  Review of Systems:  No other skin or systemic complaints except as noted in HPI or Assessment and Plan.  Objective  Well appearing patient in no apparent distress; mood and affect are within normal limits.  A focused examination was performed of the following areas: Right Upper Back  Relevant exam findings are noted in the Assessment and Plan.   Assessment & Plan   Encounter for Removal of Sutures - Incision site at the right Upper Back is clean, dry and intact - Wound cleansed & sutures removed. Patient tolerated well.  - Patient advised to keep massaging incision daily with vasoline or mupirocin ointment to prevent keloid and/or worsening scarring. - Scars remodel for a full year. - Patient advised to call with any concerns or if they notice any new or changing lesions.   Return in about 1 year (around 03/25/2024) for TBSE F/U.  ***  Documentation: I have reviewed the above documentation for accuracy and completeness, and I agree with the above.  Stasia Cavalier, am acting as scribe for Langston Reusing, DO.   Langston Reusing, DO

## 2023-03-26 NOTE — Patient Instructions (Signed)
Due to recent changes in healthcare laws, you may see results of your pathology and/or laboratory studies on MyChart before the doctors have had a chance to review them. We understand that in some cases there may be results that are confusing or concerning to you. Please understand that not all results are received at the same time and often the doctors may need to interpret multiple results in order to provide you with the best plan of care or course of treatment. Therefore, we ask that you please give us 2 business days to thoroughly review all your results before contacting the office for clarification. Should we see a critical lab result, you will be contacted sooner.   If You Need Anything After Your Visit  If you have any questions or concerns for your doctor, please call our main line at [phone] If no one answers, please leave a voicemail as directed and we will return your call as soon as possible. Messages left after 4 pm will be answered the following business day.   You may also send us a message via MyChart. We typically respond to MyChart messages within 1-2 business days.  For prescription refills, please ask your pharmacy to contact our office. Our fax number is [phone].  If you have an urgent issue when the clinic is closed that cannot wait until the next business day, you can page your doctor at the number below.    Please note that while we do our best to be available for urgent issues outside of office hours, we are not available 24/7.   If you have an urgent issue and are unable to reach us, you may choose to seek medical care at your doctor's office, retail clinic, urgent care center, or emergency room.  If you have a medical emergency, please immediately call 911 or go to the emergency department. In the event of inclement weather, please call our main line at [phone] for an update on the status of any delays or closures.  Dermatology Medication Tips: Please  keep the boxes that topical medications come in in order to help keep track of the instructions about where and how to use these. Pharmacies typically print the medication instructions only on the boxes and not directly on the medication tubes.   If your medication is too expensive, please contact our office at [phone] or send us a message through MyChart.   We are unable to tell what your co-pay for medications will be in advance as this is different depending on your insurance coverage. However, we may be able to find a substitute medication at lower cost or fill out paperwork to get insurance to cover a needed medication.   If a prior authorization is required to get your medication covered by your insurance company, please allow us 1-2 business days to complete this process.  Drug prices often vary depending on where the prescription is filled and some pharmacies may offer cheaper prices.  The website www.goodrx.com contains coupons for medications through different pharmacies. The prices here do not account for what the cost may be with help from insurance (it may be cheaper with your insurance), but the website can give you the price if you did not use any insurance.  - You can print the associated coupon and take it with your prescription to the pharmacy.  - You may also stop by our office during regular business hours and pick up a GoodRx coupon card.  - If you need your   prescription sent electronically to a different pharmacy, notify our office through [place] MyChart or by phone at [phone]     

## 2023-05-06 DIAGNOSIS — M810 Age-related osteoporosis without current pathological fracture: Secondary | ICD-10-CM | POA: Diagnosis not present

## 2023-05-08 DIAGNOSIS — F331 Major depressive disorder, recurrent, moderate: Secondary | ICD-10-CM | POA: Diagnosis not present

## 2023-05-08 DIAGNOSIS — F411 Generalized anxiety disorder: Secondary | ICD-10-CM | POA: Diagnosis not present

## 2023-05-12 DIAGNOSIS — R159 Full incontinence of feces: Secondary | ICD-10-CM | POA: Diagnosis not present

## 2023-05-13 DIAGNOSIS — M810 Age-related osteoporosis without current pathological fracture: Secondary | ICD-10-CM | POA: Diagnosis not present

## 2023-06-30 DIAGNOSIS — Z79899 Other long term (current) drug therapy: Secondary | ICD-10-CM | POA: Diagnosis not present

## 2023-06-30 DIAGNOSIS — I10 Essential (primary) hypertension: Secondary | ICD-10-CM | POA: Diagnosis not present

## 2023-06-30 DIAGNOSIS — Z9682 Presence of neurostimulator: Secondary | ICD-10-CM | POA: Diagnosis not present

## 2023-06-30 DIAGNOSIS — R350 Frequency of micturition: Secondary | ICD-10-CM | POA: Diagnosis not present

## 2023-06-30 DIAGNOSIS — M8580 Other specified disorders of bone density and structure, unspecified site: Secondary | ICD-10-CM | POA: Diagnosis not present

## 2023-06-30 DIAGNOSIS — R159 Full incontinence of feces: Secondary | ICD-10-CM | POA: Diagnosis not present

## 2023-06-30 DIAGNOSIS — K219 Gastro-esophageal reflux disease without esophagitis: Secondary | ICD-10-CM | POA: Diagnosis not present

## 2023-06-30 DIAGNOSIS — E039 Hypothyroidism, unspecified: Secondary | ICD-10-CM | POA: Diagnosis not present

## 2023-06-30 DIAGNOSIS — R3915 Urgency of urination: Secondary | ICD-10-CM | POA: Diagnosis not present

## 2023-07-06 DIAGNOSIS — F411 Generalized anxiety disorder: Secondary | ICD-10-CM | POA: Diagnosis not present

## 2023-07-06 DIAGNOSIS — F331 Major depressive disorder, recurrent, moderate: Secondary | ICD-10-CM | POA: Diagnosis not present

## 2023-07-07 DIAGNOSIS — R159 Full incontinence of feces: Secondary | ICD-10-CM | POA: Diagnosis not present

## 2023-07-10 DIAGNOSIS — Z960 Presence of urogenital implants: Secondary | ICD-10-CM | POA: Diagnosis not present

## 2023-07-10 DIAGNOSIS — Z9889 Other specified postprocedural states: Secondary | ICD-10-CM | POA: Diagnosis not present

## 2023-07-12 DIAGNOSIS — Z9889 Other specified postprocedural states: Secondary | ICD-10-CM | POA: Insufficient documentation

## 2023-07-12 DIAGNOSIS — Z960 Presence of urogenital implants: Secondary | ICD-10-CM | POA: Insufficient documentation

## 2023-07-14 DIAGNOSIS — E039 Hypothyroidism, unspecified: Secondary | ICD-10-CM | POA: Diagnosis not present

## 2023-07-14 DIAGNOSIS — Z79899 Other long term (current) drug therapy: Secondary | ICD-10-CM | POA: Diagnosis not present

## 2023-07-14 DIAGNOSIS — I10 Essential (primary) hypertension: Secondary | ICD-10-CM | POA: Diagnosis not present

## 2023-07-14 DIAGNOSIS — R159 Full incontinence of feces: Secondary | ICD-10-CM | POA: Diagnosis not present

## 2023-07-14 DIAGNOSIS — K219 Gastro-esophageal reflux disease without esophagitis: Secondary | ICD-10-CM | POA: Diagnosis not present

## 2023-07-14 DIAGNOSIS — R350 Frequency of micturition: Secondary | ICD-10-CM | POA: Diagnosis not present

## 2023-07-14 DIAGNOSIS — R3915 Urgency of urination: Secondary | ICD-10-CM | POA: Diagnosis not present

## 2023-07-29 DIAGNOSIS — Z960 Presence of urogenital implants: Secondary | ICD-10-CM | POA: Diagnosis not present

## 2023-07-29 DIAGNOSIS — Z9889 Other specified postprocedural states: Secondary | ICD-10-CM | POA: Diagnosis not present

## 2023-07-29 DIAGNOSIS — R159 Full incontinence of feces: Secondary | ICD-10-CM | POA: Diagnosis not present

## 2023-08-20 ENCOUNTER — Ambulatory Visit: Payer: PPO | Admitting: Dermatology

## 2023-08-31 DIAGNOSIS — F331 Major depressive disorder, recurrent, moderate: Secondary | ICD-10-CM | POA: Diagnosis not present

## 2023-08-31 DIAGNOSIS — F411 Generalized anxiety disorder: Secondary | ICD-10-CM | POA: Diagnosis not present

## 2023-09-28 DIAGNOSIS — Z0000 Encounter for general adult medical examination without abnormal findings: Secondary | ICD-10-CM | POA: Diagnosis not present

## 2023-09-28 DIAGNOSIS — Z9181 History of falling: Secondary | ICD-10-CM | POA: Diagnosis not present

## 2023-09-28 DIAGNOSIS — I10 Essential (primary) hypertension: Secondary | ICD-10-CM | POA: Diagnosis not present

## 2023-09-28 DIAGNOSIS — G4700 Insomnia, unspecified: Secondary | ICD-10-CM | POA: Diagnosis not present

## 2023-09-28 DIAGNOSIS — E039 Hypothyroidism, unspecified: Secondary | ICD-10-CM | POA: Diagnosis not present

## 2023-09-28 DIAGNOSIS — D563 Thalassemia minor: Secondary | ICD-10-CM | POA: Diagnosis not present

## 2023-09-28 DIAGNOSIS — I700 Atherosclerosis of aorta: Secondary | ICD-10-CM | POA: Diagnosis not present

## 2023-09-28 DIAGNOSIS — Z131 Encounter for screening for diabetes mellitus: Secondary | ICD-10-CM | POA: Diagnosis not present

## 2023-09-28 DIAGNOSIS — J449 Chronic obstructive pulmonary disease, unspecified: Secondary | ICD-10-CM | POA: Diagnosis not present

## 2023-09-28 DIAGNOSIS — F3340 Major depressive disorder, recurrent, in remission, unspecified: Secondary | ICD-10-CM | POA: Diagnosis not present

## 2023-10-27 DIAGNOSIS — F411 Generalized anxiety disorder: Secondary | ICD-10-CM | POA: Diagnosis not present

## 2023-10-27 DIAGNOSIS — F331 Major depressive disorder, recurrent, moderate: Secondary | ICD-10-CM | POA: Diagnosis not present

## 2023-10-29 DIAGNOSIS — Z960 Presence of urogenital implants: Secondary | ICD-10-CM | POA: Diagnosis not present

## 2023-11-04 DIAGNOSIS — H25013 Cortical age-related cataract, bilateral: Secondary | ICD-10-CM | POA: Diagnosis not present

## 2023-11-04 DIAGNOSIS — H2513 Age-related nuclear cataract, bilateral: Secondary | ICD-10-CM | POA: Diagnosis not present

## 2023-11-04 DIAGNOSIS — H52201 Unspecified astigmatism, right eye: Secondary | ICD-10-CM | POA: Diagnosis not present

## 2023-11-11 ENCOUNTER — Emergency Department (HOSPITAL_BASED_OUTPATIENT_CLINIC_OR_DEPARTMENT_OTHER): Payer: Medicare HMO | Admitting: Radiology

## 2023-11-11 ENCOUNTER — Encounter (HOSPITAL_BASED_OUTPATIENT_CLINIC_OR_DEPARTMENT_OTHER): Payer: Self-pay | Admitting: Emergency Medicine

## 2023-11-11 ENCOUNTER — Emergency Department (HOSPITAL_BASED_OUTPATIENT_CLINIC_OR_DEPARTMENT_OTHER)
Admission: EM | Admit: 2023-11-11 | Discharge: 2023-11-11 | Disposition: A | Discharge status: Home / Self Care | Payer: Medicare HMO | Attending: Emergency Medicine | Admitting: Emergency Medicine

## 2023-11-11 ENCOUNTER — Other Ambulatory Visit: Payer: Self-pay

## 2023-11-11 DIAGNOSIS — S82224A Nondisplaced transverse fracture of shaft of right tibia, initial encounter for closed fracture: Secondary | ICD-10-CM | POA: Diagnosis not present

## 2023-11-11 DIAGNOSIS — W101XXA Fall (on)(from) sidewalk curb, initial encounter: Secondary | ICD-10-CM | POA: Insufficient documentation

## 2023-11-11 DIAGNOSIS — Y92480 Sidewalk as the place of occurrence of the external cause: Secondary | ICD-10-CM | POA: Diagnosis not present

## 2023-11-11 DIAGNOSIS — S82141A Displaced bicondylar fracture of right tibia, initial encounter for closed fracture: Secondary | ICD-10-CM | POA: Diagnosis not present

## 2023-11-11 DIAGNOSIS — S99911A Unspecified injury of right ankle, initial encounter: Secondary | ICD-10-CM | POA: Diagnosis not present

## 2023-11-11 DIAGNOSIS — M25561 Pain in right knee: Secondary | ICD-10-CM | POA: Diagnosis present

## 2023-11-11 MED ORDER — HYDROCODONE-ACETAMINOPHEN 5-325 MG PO TABS
1.0000 | ORAL_TABLET | Freq: Once | ORAL | Status: AC
  Administered 2023-11-11: 1 via ORAL
  Filled 2023-11-11: qty 1

## 2023-11-11 MED ORDER — OXYCODONE HCL 5 MG PO TABS: 5.0000 mg | ORAL_TABLET | ORAL | 0 refills | Status: DC | PRN

## 2023-11-11 NOTE — ED Provider Notes (Signed)
 [place] EMERGENCY DEPARTMENT AT [facility] Provider Note   CSN: [id number] Arrival date & time: 11/11/23  1445     History  Chief Complaint  Patient presents with   Cheryl Burgess    Cheryl Burgess is a 68 y.o. female.  68 year old female presents today for concern of right knee pain.  She had a fall as she was stepping down from a sidewalk earlier today.  She also believes she rolled her right ankle.  Has not attempted to bear weight since the fall.  No head injury or loss of consciousness.  No other complaints.  The history is provided by the patient. No language interpreter was used.       Home Medications Prior to Admission medications   Medication Sig Start Date End Date Taking? Authorizing Provider  oxyCODONE  (ROXICODONE ) 5 MG immediate release tablet Take 1 tablet (5 mg total) by mouth every 4 (four) hours as needed for severe pain (pain score 7-10). 11/11/23  Yes Hildegard, Teala Daffron, PA-C  ALPRAZolam  (XANAX ) 0.25 MG tablet Take 0.25 mg by mouth at bedtime. Patient not taking: Reported on 02/23/2023 01/10/22   [provider]  ascorbic acid (VITAMIN C) 250 MG CHEW Chew 250 mg by mouth daily.    [provider]  buPROPion  (WELLBUTRIN  XL) 150 MG 24 hr tablet Take 150 mg by mouth every morning. 02/07/22   [provider]  buPROPion  (WELLBUTRIN  XL) 300 MG 24 hr tablet Take 1 tablet (300 mg total) by mouth daily. Patient not taking: Reported on 02/23/2023 11/19/20   Pucilowski, Olgierd A, MD  clobetasol  (TEMOVATE ) 0.05 % external solution Apply 1 Application topically 2 (two) times daily. Apply to affected areas twice daily for 2 weeks then stop. 02/19/23   Alm Delon SAILOR, DO  Cobalamin Combinations (B12 FOLATE PO) Take by mouth.    [provider]  estradiol (ESTRACE) 0.1 MG/GM vaginal cream SMARTSIG:1 Gram(s) Vaginal 2-3 Times Weekly 11/06/21   [provider]  fluocinonide  (LIDEX ) 0.05 % external solution Apply 1 application topically 2 (two)  times daily as needed. 03/25/21   Tafeen, Stuart, MD  folic acid (FOLVITE) 400 MCG tablet 1 tablet Patient not taking: Reported on 02/23/2023    [provider]  gabapentin (NEURONTIN) 600 MG tablet Take 600 mg by mouth 3 (three) times daily.    [provider]  imipramine  (TOFRANIL ) 25 MG tablet Take one tablet in AM and two at bedtime. 11/19/20   Pucilowski, Olgierd A, MD  levothyroxine (SYNTHROID) 75 MCG tablet Take 75 mcg by mouth daily before breakfast.    [provider]  losartan-hydrochlorothiazide (HYZAAR) 100-25 MG tablet Take 1 tablet by mouth daily.    [provider]  Multiple Vitamins-Minerals (PRESERVISION AREDS) TABS two in am two in pm    [provider]  mupirocin  ointment (BACTROBAN ) 2 % Apply 1 Application topically 2 (two) times daily. Apply to wound twice daily and cover with bandage 03/12/23   Alm Delon SAILOR, DO  niacinamide 500 MG tablet Take 500 mg by mouth 2 (two) times daily with a meal.    [provider]  nitrofurantoin (MACRODANTIN) 100 MG capsule TAKE 1 CAPSULE BY MOUTH EVERYDAY AT BEDTIME 01/21/21   [provider]  omeprazole (PRILOSEC) 40 MG capsule Take 40 mg by mouth daily. 06/11/20   [provider]  OVER THE COUNTER MEDICATION Take 1 tablet by mouth daily. [place]    [provider]  Polypodium Leucotomos (HELIOCARE PO) Take by mouth.  [provider]  rOPINIRole (REQUIP) 3 MG tablet Take 3 mg by mouth at bedtime.    [provider]  Vitamin D-Vitamin K (K2 PLUS D3 PO) Take by mouth.    [provider]  zolpidem  (AMBIEN  CR) 12.5 MG CR tablet Take 1 tablet (12.5 mg total) by mouth at bedtime as needed for sleep. 02/27/21 05/28/21  Curry Leni DASEN, MD      Allergies    Keflex [cephalexin]    Review of Systems   Review of Systems  Constitutional:  Negative for chills and fever.  Musculoskeletal:  Positive for arthralgias. Negative for joint swelling.   All other systems reviewed and are negative.   Physical Exam Updated Vital Signs BP 102/75 (BP Location: Left Arm)   Pulse 86   Temp 98.3 F (36.8 C)   Resp 16   LMP 12/31/2012   SpO2 97%  Physical Exam Vitals and nursing note reviewed.  Constitutional:      General: She is not in acute distress.    Appearance: Normal appearance. She is not ill-appearing.  HENT:     Head: Normocephalic and atraumatic.     Nose: Nose normal.  Eyes:     Conjunctiva/sclera: Conjunctivae normal.  Cardiovascular:     Rate and Rhythm: Normal rate.  Pulmonary:     Effort: Pulmonary effort is normal. No respiratory distress.  Musculoskeletal:        General: No deformity.  Skin:    Findings: No rash.  Neurological:     Mental Status: She is alert.     ED Results / Procedures / Treatments   Labs (all labs ordered are listed, but only abnormal results are displayed) Labs Reviewed - No data to display  EKG None  Radiology DG Ankle Complete Right Result Date: 11/11/2023 CLINICAL DATA:  [place]lem[place] with twisting injury EXAM: RIGHT ANKLE - COMPLETE 3+ VIEW COMPARISON:  None Available. FINDINGS: There is no evidence of fracture, dislocation, or joint effusion. There is no evidence of arthropathy or other focal bone abnormality. Soft tissues are unremarkable. IMPRESSION: Negative. Electronically Signed   By: Oneil Officer M.D.   On: 11/11/2023 17:10   DG Knee Complete 4 Views Right Result Date: 11/11/2023 CLINICAL DATA:  Fall EXAM: RIGHT KNEE - COMPLETE 4+ VIEW COMPARISON:  None Available. FINDINGS: There is an acute transverse fracture through the proximal tibial diaphysis 5.5 cm inferior to the tibial plateau. This is nondisplaced. No dislocation. Soft tissues are within normal limits. IMPRESSION: Acute nondisplaced transverse fracture through the proximal tibial diaphysis. Electronically Signed   By: Greig Pique M.D.   On: 11/11/2023 17:10    Procedures Procedures    Medications Ordered in  ED Medications  HYDROcodone -acetaminophen  (NORCO/VICODIN) 5-325 MG per tablet 1 tablet (has no administration in time range)    ED Course/ Medical Decision Making/ A&P                                 Medical Decision Making Amount and/or Complexity of Data Reviewed Radiology: ordered.  Risk Prescription drug management.   68 year old female presents today for concern of right knee pain after a fall that occurred earlier today.  Mild pain to the right ankle reported.  X-ray obtained.  Demonstrates right proximal tibial transverse diaphysis fracture.  This is not displaced.  No other acute findings.  No concerning findings on the right ankle x-ray.  I have personally reviewed the x-ray  and agree with radiology interpretation.  Knee immobilizer, crutches provided.  Orthopedic referral given.  Strict return precaution discussed.  Symptomatic management discussed.  Short course of pain medication given.  Patient stable for discharge.  Discharged in stable condition.   Final Clinical Impression(s) / ED Diagnoses Final diagnoses:  Closed nondisplaced transverse fracture of shaft of right tibia, initial encounter    Rx / DC Orders ED Discharge Orders          Ordered    oxyCODONE  (ROXICODONE ) 5 MG immediate release tablet  Every 4 hours PRN        11/11/23 1759              Hildegard Loge, PA-C 11/11/23 1816    Cottie Donnice PARAS, MD 11/12/23 [phone]

## 2023-11-11 NOTE — ED Triage Notes (Signed)
 Trip fall. Around 8 AM Right knee pain and swelling. Painful, unable to put wt on

## 2023-11-11 NOTE — ED Provider Triage Note (Signed)
 Emergency Medicine Provider Triage Evaluation Note  Cheryl Burgess , a 68 y.o. female  was evaluated in triage.  Pt complains of fall on to right knee. Rolled right ankle. No head injury. No other complaint.  Review of Systems  Positive: As above Negative: As above  Physical Exam  BP 102/75 (BP Location: Left Arm)   Pulse 86   Temp 98.3 F (36.8 C)   Resp 16   LMP 12/31/2012   SpO2 97%  Gen:   Awake, no distress   Resp:  Normal effort MSK:   Moves extremities without difficulty  Other:    Medical Decision Making  Medically screening exam initiated at 3:55 PM.  Appropriate orders placed.  IVEY NEMBHARD was informed that the remainder of the evaluation will be completed by another provider, this initial triage assessment does not replace that evaluation, and the importance of remaining in the ED until their evaluation is complete.     Hildegard Loge, PA-C 11/11/23 1555

## 2023-11-11 NOTE — Discharge Instructions (Signed)
 You do have a fracture in your right tibia.  You were given a knee immobilizer along with crutches.  Please do not bear any weight until you follow-up with orthopedist.  I have sent in pain medication to your pharmacy.  This will cause drowsiness.  Do not drive or do anything else dangerous after taking this medication.  Primarily take Tylenol  1000 mg every 8 hours, ibuprofen 600 mg every 6-8 hours.  Apply ice for 15 minutes every 4-5 times a day.  For the emergent symptoms return to the emergency room.

## 2023-11-13 DIAGNOSIS — S82254A Nondisplaced comminuted fracture of shaft of right tibia, initial encounter for closed fracture: Secondary | ICD-10-CM | POA: Diagnosis not present

## 2023-11-23 DIAGNOSIS — S82254D Nondisplaced comminuted fracture of shaft of right tibia, subsequent encounter for closed fracture with routine healing: Secondary | ICD-10-CM | POA: Diagnosis not present

## 2023-12-09 DIAGNOSIS — M810 Age-related osteoporosis without current pathological fracture: Secondary | ICD-10-CM | POA: Diagnosis not present

## 2023-12-14 ENCOUNTER — Other Ambulatory Visit: Payer: Self-pay | Admitting: Obstetrics and Gynecology

## 2023-12-14 DIAGNOSIS — S82254D Nondisplaced comminuted fracture of shaft of right tibia, subsequent encounter for closed fracture with routine healing: Secondary | ICD-10-CM | POA: Diagnosis not present

## 2023-12-14 DIAGNOSIS — Z1231 Encounter for screening mammogram for malignant neoplasm of breast: Secondary | ICD-10-CM

## 2023-12-16 DIAGNOSIS — M810 Age-related osteoporosis without current pathological fracture: Secondary | ICD-10-CM | POA: Diagnosis not present

## 2023-12-21 DIAGNOSIS — F331 Major depressive disorder, recurrent, moderate: Secondary | ICD-10-CM | POA: Diagnosis not present

## 2023-12-21 DIAGNOSIS — F411 Generalized anxiety disorder: Secondary | ICD-10-CM | POA: Diagnosis not present

## 2024-01-05 DIAGNOSIS — H25811 Combined forms of age-related cataract, right eye: Secondary | ICD-10-CM | POA: Diagnosis not present

## 2024-01-05 DIAGNOSIS — H2511 Age-related nuclear cataract, right eye: Secondary | ICD-10-CM | POA: Diagnosis not present

## 2024-01-05 DIAGNOSIS — Z961 Presence of intraocular lens: Secondary | ICD-10-CM | POA: Diagnosis not present

## 2024-01-05 DIAGNOSIS — H25011 Cortical age-related cataract, right eye: Secondary | ICD-10-CM | POA: Diagnosis not present

## 2024-01-11 ENCOUNTER — Ambulatory Visit
Admission: RE | Admit: 2024-01-11 | Discharge: 2024-01-11 | Disposition: A | Discharge status: Home / Self Care | Source: Ambulatory Visit | Attending: Obstetrics and Gynecology

## 2024-01-11 DIAGNOSIS — Z1231 Encounter for screening mammogram for malignant neoplasm of breast: Secondary | ICD-10-CM

## 2024-01-13 DIAGNOSIS — S82254D Nondisplaced comminuted fracture of shaft of right tibia, subsequent encounter for closed fracture with routine healing: Secondary | ICD-10-CM | POA: Diagnosis not present

## 2024-01-26 DIAGNOSIS — H25012 Cortical age-related cataract, left eye: Secondary | ICD-10-CM | POA: Diagnosis not present

## 2024-01-26 DIAGNOSIS — Z961 Presence of intraocular lens: Secondary | ICD-10-CM | POA: Diagnosis not present

## 2024-01-26 DIAGNOSIS — H25812 Combined forms of age-related cataract, left eye: Secondary | ICD-10-CM | POA: Diagnosis not present

## 2024-01-26 DIAGNOSIS — H2512 Age-related nuclear cataract, left eye: Secondary | ICD-10-CM | POA: Diagnosis not present

## 2024-02-08 DIAGNOSIS — Z1272 Encounter for screening for malignant neoplasm of vagina: Secondary | ICD-10-CM | POA: Diagnosis not present

## 2024-02-08 DIAGNOSIS — Z124 Encounter for screening for malignant neoplasm of cervix: Secondary | ICD-10-CM | POA: Diagnosis not present

## 2024-02-08 DIAGNOSIS — Z6822 Body mass index (BMI) 22.0-22.9, adult: Secondary | ICD-10-CM | POA: Diagnosis not present

## 2024-02-08 DIAGNOSIS — M810 Age-related osteoporosis without current pathological fracture: Secondary | ICD-10-CM | POA: Diagnosis not present

## 2024-02-09 DIAGNOSIS — E039 Hypothyroidism, unspecified: Secondary | ICD-10-CM | POA: Diagnosis not present

## 2024-02-09 DIAGNOSIS — M810 Age-related osteoporosis without current pathological fracture: Secondary | ICD-10-CM | POA: Diagnosis not present

## 2024-02-10 DIAGNOSIS — S82254D Nondisplaced comminuted fracture of shaft of right tibia, subsequent encounter for closed fracture with routine healing: Secondary | ICD-10-CM | POA: Diagnosis not present

## 2024-02-12 DIAGNOSIS — F411 Generalized anxiety disorder: Secondary | ICD-10-CM | POA: Diagnosis not present

## 2024-02-12 DIAGNOSIS — F331 Major depressive disorder, recurrent, moderate: Secondary | ICD-10-CM | POA: Diagnosis not present

## 2024-02-18 ENCOUNTER — Encounter: Payer: Self-pay | Admitting: Dermatology

## 2024-02-18 ENCOUNTER — Ambulatory Visit: Payer: PPO | Admitting: Dermatology

## 2024-02-18 VITALS — BP 75/45 | HR 86

## 2024-02-18 DIAGNOSIS — M810 Age-related osteoporosis without current pathological fracture: Secondary | ICD-10-CM | POA: Insufficient documentation

## 2024-02-18 DIAGNOSIS — D3617 Benign neoplasm of peripheral nerves and autonomic nervous system of trunk, unspecified: Secondary | ICD-10-CM | POA: Diagnosis not present

## 2024-02-18 DIAGNOSIS — L578 Other skin changes due to chronic exposure to nonionizing radiation: Secondary | ICD-10-CM

## 2024-02-18 DIAGNOSIS — D1801 Hemangioma of skin and subcutaneous tissue: Secondary | ICD-10-CM

## 2024-02-18 DIAGNOSIS — D492 Neoplasm of unspecified behavior of bone, soft tissue, and skin: Secondary | ICD-10-CM

## 2024-02-18 DIAGNOSIS — W908XXA Exposure to other nonionizing radiation, initial encounter: Secondary | ICD-10-CM | POA: Diagnosis not present

## 2024-02-18 DIAGNOSIS — L814 Other melanin hyperpigmentation: Secondary | ICD-10-CM

## 2024-02-18 DIAGNOSIS — E039 Hypothyroidism, unspecified: Secondary | ICD-10-CM | POA: Insufficient documentation

## 2024-02-18 DIAGNOSIS — L821 Other seborrheic keratosis: Secondary | ICD-10-CM | POA: Diagnosis not present

## 2024-02-18 DIAGNOSIS — D485 Neoplasm of uncertain behavior of skin: Secondary | ICD-10-CM

## 2024-02-18 DIAGNOSIS — Z1283 Encounter for screening for malignant neoplasm of skin: Secondary | ICD-10-CM | POA: Diagnosis not present

## 2024-02-18 DIAGNOSIS — Z85828 Personal history of other malignant neoplasm of skin: Secondary | ICD-10-CM

## 2024-02-18 DIAGNOSIS — Z808 Family history of malignant neoplasm of other organs or systems: Secondary | ICD-10-CM

## 2024-02-18 DIAGNOSIS — D229 Melanocytic nevi, unspecified: Secondary | ICD-10-CM

## 2024-02-18 HISTORY — DX: Personal history of other malignant neoplasm of skin: Z85.828

## 2024-02-18 NOTE — Progress Notes (Signed)
   Total Body Skin Exam (TBSE) Visit   Subjective  Cheryl Burgess is a 68 y.o. female who presents for the following: Skin Cancer Screening and Full Body Skin Exam  Patient presents today for follow up visit for TBSE. Patient was last evaluated on 03/26/23 . Patient denies medication changes. Patient reports she does not have spots, moles and lesions of concern to be evaluated. Patient reports throughout her lifetime she has had moderate sun exposure. Currently, patient reports if she has excessive sun exposure, she does apply sunscreen and/or wears protective coverings. Patient reports she has hx of bx (Hx of BCC and SCC). Patient reports  family history of skin cancers. The patient has spots, moles and lesions to be evaluated, some may be new or changing and the patient has concerns that these could be cancer.  The following portions of the chart were reviewed this encounter and updated as appropriate: medications, allergies, medical history  Review of Systems:  No other skin or systemic complaints except as noted in HPI or Assessment and Plan.  Objective  Well appearing patient in no apparent distress; mood and affect are within normal limits.  A full examination was performed including scalp, head, eyes, ears, nose, lips, neck, chest, axillae, abdomen, back, buttocks, bilateral upper extremities, bilateral lower extremities, hands, feet, fingers, toes, fingernails, and toenails. All findings within normal limits unless otherwise noted below.   Relevant physical exam findings are noted in the Assessment and Plan.  Mid Back R?O BCC   Assessment & Plan   LENTIGINES, SEBORRHEIC KERATOSES, HEMANGIOMAS - Benign normal skin lesions - Benign-appearing - Call for any changes  MELANOCYTIC NEVI - Tan-brown and/or pink-flesh-colored symmetric macules and papules - Benign appearing on exam today - Observation - Call clinic for new or changing moles - Recommend daily use of broad spectrum spf  30+ sunscreen to sun-exposed areas.   ACTINIC DAMAGE - Chronic condition, secondary to cumulative UV/sun exposure - diffuse scaly erythematous macules with underlying dyspigmentation - Recommend daily broad spectrum sunscreen SPF 30+ to sun-exposed areas, reapply every 2 hours as needed.  - Staying in the shade or wearing long sleeves, sun glasses (UVA+UVB protection) and wide brim hats (4-inch brim around the entire circumference of the hat) are also recommended for sun protection.  - Call for new or changing lesions.  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  - Call if any new or changing lesions are noted between office visits   HISTORY OF SQUAMOUS CELL CARCINOMA OF THE SKIN - No evidence of recurrence today - No lymphadenopathy - Recommend regular full body skin exams - Recommend daily broad spectrum sunscreen SPF 30+ to sun-exposed areas, reapply every 2 hours as needed.  - Call if any new or changing lesions are noted between office visits  SKIN CANCER SCREENING PERFORMED TODAY.   No follow-ups on file.  I, Melvin Whiteford, am acting as Neurosurgeon for [employer], DO.  Documentation: I have reviewed the above documentation for accuracy and completeness, and I agree with the above.  Louana Roup, DO

## 2024-02-18 NOTE — Patient Instructions (Signed)

## 2024-02-19 LAB — SURGICAL PATHOLOGY

## 2024-02-22 ENCOUNTER — Ambulatory Visit: Payer: Self-pay | Admitting: Dermatology

## 2024-03-01 DIAGNOSIS — E039 Hypothyroidism, unspecified: Secondary | ICD-10-CM | POA: Diagnosis not present

## 2024-03-01 DIAGNOSIS — M810 Age-related osteoporosis without current pathological fracture: Secondary | ICD-10-CM | POA: Diagnosis not present

## 2024-03-04 DIAGNOSIS — M8588 Other specified disorders of bone density and structure, other site: Secondary | ICD-10-CM | POA: Diagnosis not present

## 2024-03-04 DIAGNOSIS — N958 Other specified menopausal and perimenopausal disorders: Secondary | ICD-10-CM | POA: Diagnosis not present

## 2024-04-06 DIAGNOSIS — F411 Generalized anxiety disorder: Secondary | ICD-10-CM | POA: Diagnosis not present

## 2024-04-06 DIAGNOSIS — F331 Major depressive disorder, recurrent, moderate: Secondary | ICD-10-CM | POA: Diagnosis not present

## 2024-04-11 ENCOUNTER — Other Ambulatory Visit (HOSPITAL_BASED_OUTPATIENT_CLINIC_OR_DEPARTMENT_OTHER): Payer: Self-pay | Admitting: Physician Assistant

## 2024-04-11 DIAGNOSIS — I10 Essential (primary) hypertension: Secondary | ICD-10-CM | POA: Diagnosis not present

## 2024-04-11 DIAGNOSIS — R0602 Shortness of breath: Secondary | ICD-10-CM | POA: Diagnosis not present

## 2024-04-11 DIAGNOSIS — I2510 Atherosclerotic heart disease of native coronary artery without angina pectoris: Secondary | ICD-10-CM | POA: Diagnosis not present

## 2024-04-11 DIAGNOSIS — Z720 Tobacco use: Secondary | ICD-10-CM | POA: Diagnosis not present

## 2024-04-11 DIAGNOSIS — J449 Chronic obstructive pulmonary disease, unspecified: Secondary | ICD-10-CM | POA: Diagnosis not present

## 2024-04-12 DIAGNOSIS — M810 Age-related osteoporosis without current pathological fracture: Secondary | ICD-10-CM | POA: Diagnosis not present

## 2024-05-04 ENCOUNTER — Ambulatory Visit (HOSPITAL_BASED_OUTPATIENT_CLINIC_OR_DEPARTMENT_OTHER)
Admission: RE | Admit: 2024-05-04 | Discharge: 2024-05-04 | Disposition: A | Discharge status: Home / Self Care | Payer: Self-pay | Source: Ambulatory Visit | Attending: Physician Assistant | Admitting: Physician Assistant

## 2024-05-04 DIAGNOSIS — R0602 Shortness of breath: Secondary | ICD-10-CM | POA: Insufficient documentation

## 2024-06-01 ENCOUNTER — Ambulatory Visit: Admitting: Pulmonary Disease

## 2024-06-01 VITALS — BP 137/78 | HR 78 | Ht 63.0 in | Wt 127.0 lb

## 2024-06-01 DIAGNOSIS — J432 Centrilobular emphysema: Secondary | ICD-10-CM | POA: Diagnosis not present

## 2024-06-01 DIAGNOSIS — F411 Generalized anxiety disorder: Secondary | ICD-10-CM | POA: Diagnosis not present

## 2024-06-01 DIAGNOSIS — F331 Major depressive disorder, recurrent, moderate: Secondary | ICD-10-CM | POA: Diagnosis not present

## 2024-06-01 MED ORDER — STIOLTO RESPIMAT 2.5-2.5 MCG/ACT IN AERS: 2.0000 | INHALATION_SPRAY | Freq: Every day | RESPIRATORY_TRACT | 3 refills | Status: DC

## 2024-06-01 NOTE — Patient Instructions (Addendum)
 COPD/emphysema  Schedule for pulmonary function test to assess your lung function  Stiolto is on your preferred medication list, will change to Stiolto 1 inhalation daily  Graded exercises as tolerated - If you have to start exercising regularly this is the only way you can make some improvement with your activity tolerance  Walking, weight training, resistance band training -Start slow and make progress regularly  Call us  with significant concerns

## 2024-06-01 NOTE — Progress Notes (Signed)
 Cheryl Burgess    [id number]    1955/10/21  Primary Care Physician:Shaw, Joen, MD  Referring Physician: Loreli Joen, MD 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  She does have occasional cough with occasional sputum production Not feeling acutely ill  Any significant amount of walking makes her short of breath, rest does make it better Denies any chronic chest pain or chest discomfort Does not have any known heart disease to her   Was prescribed Symbicort, was not covered by insurance  Denies any significant musculoskeletal pain or discomfort  Does have a history of restless legs, depression, hypothyroidism, generalized anxiety disorder  Outpatient Encounter Medications as of 06/01/2024  Medication Sig   ABRYSVO 120 MCG/0.5ML injection    albuterol (VENTOLIN HFA) 108 (90 Base) MCG/ACT inhaler Inhale 2 puffs into the lungs.   ALPRAZolam  (XANAX ) 0.25 MG tablet Take 0.25 mg by mouth at bedtime.   bimatoprost (LATISSE) 0.03 % ophthalmic solution APPLY TO UPPER LASHES DAILY FOR 1 MONTH ,THEN TWICE DAILY PER WEEK   buPROPion  (WELLBUTRIN  XL) 150 MG 24 hr tablet Take 150 mg by mouth every morning.   Cholecalciferol (D 1000) 25 MCG (1000 UT) capsule Take 1,000 Units by mouth.   gabapentin (NEURONTIN) 600 MG tablet Take 600 mg by mouth 3 (three) times daily.   imipramine  (TOFRANIL ) 25 MG tablet Take one tablet in AM and two at bedtime.   imipramine  (TOFRANIL -PM) 75 MG capsule 1 capsule.   levothyroxine (SYNTHROID) 75 MCG tablet Take 75 mcg by mouth daily before breakfast.   losartan (COZAAR) 100 MG tablet 1 tablet Orally Once a day    losartan-hydrochlorothiazide (HYZAAR) 100-25 MG tablet Take 1 tablet by mouth daily.   omeprazole (PRILOSEC) 40 MG capsule Take 40 mg by mouth daily.   rOPINIRole (REQUIP) 3 MG tablet Take 3 mg by mouth at bedtime.   triamcinolone  cream (KENALOG) 0.1 % Apply 1 Application topically 2 (two) times daily as needed.   zolpidem  (AMBIEN  CR) 12.5 MG CR tablet Take 1 tablet (12.5 mg total) by mouth at bedtime as needed for sleep.   alendronate (FOSAMAX) 70 MG tablet Take by mouth.   ascorbic acid (VITAMIN C) 250 MG CHEW Chew 250 mg by mouth daily.   budesonide-formoterol (SYMBICORT) 160-4.5 MCG/ACT inhaler Inhale 2 puffs into the lungs.   buPROPion  (WELLBUTRIN  XL) 300 MG 24 hr tablet Take 1 tablet (300 mg total) by mouth daily. (Patient not taking: Reported on 02/23/2023)   clobetasol  (TEMOVATE ) 0.05 % external solution Apply 1 Application topically 2 (two) times daily. Apply to affected areas twice daily for 2 weeks then stop.   Cobalamin Combinations (B12 FOLATE PO) Take by mouth.   cyanocobalamin (VITAMIN B12) 500 MCG tablet Take 500 mcg by mouth.   Ferrous Sulfate (IRON) 325 (65 Fe) MG TABS 1 tablet Orally Three times a Week   fluocinonide  (LIDEX ) 0.05 % external solution Apply 1 application topically 2 (two) times daily as needed.   folic acid (FOLVITE) 400 MCG tablet 1 tablet (Patient not taking: Reported on 02/23/2023)   Magnesium 250 MG TABS  1 tablet with a meal Orally Once a day   magnesium chloride (SLOW-MAG) 64 MG TBEC SR tablet Take 70 mg by mouth.   Multiple Vitamins-Minerals (PRESERVISION AREDS) TABS two in am two in pm   mupirocin  ointment (BACTROBAN ) 2 % Apply 1 Application topically 2 (two) times daily. Apply to wound twice daily and cover with bandage   niacinamide 500 MG tablet Take 500 mg by mouth 2 (two) times daily with a meal.   nitrofurantoin (MACRODANTIN) 100 MG capsule TAKE 1 CAPSULE BY MOUTH EVERYDAY AT BEDTIME   OVER THE COUNTER MEDICATION Take 1 tablet by mouth daily. [place]   oxyCODONE  (ROXICODONE ) 5 MG immediate release tablet Take 1 tablet (5 mg total) by mouth every 4 (four) hours as needed for severe pain (pain score 7-10). (Patient not taking: Reported on 06/01/2024)   Polypodium Leucotomos (HELIOCARE PO) Take by mouth.   Vitamin D-Vitamin K (K2 PLUS D3 PO) Take by mouth.   No facility-administered encounter medications on file as of 06/01/2024.    Allergies as of 06/01/2024 - Review Complete 06/01/2024  Allergen Reaction Noted   Rosuvastatin  Other (See Comments) 05/09/2024   Keflex [cephalexin] Itching and Rash 03/30/2019    Past Medical History:  Diagnosis Date   Ambien  use disorder, mild, abuse (HCC)    Anemia, unspecified    Atherosclerosis of aorta (HCC)    Benzodiazepine abuse (HCC)    COPD (chronic obstructive pulmonary disease) (HCC)    Depression    History of basal cell carcinoma (BCC) 02/18/2024   Right Upper Back (Excised on 03/12/23 Margins Clear)    Hypertension    Hypothyroidism    Insomnia    Menopausal syndrome (hot flashes)    Recurrent UTI    RLS (restless legs syndrome)    Squamous cell carcinoma of skin    Superficial basal cell carcinoma (BCC) 04/19/2013   Post Right Knee (Cx3,5FU)   Thalassemia minor     Past Surgical History:  Procedure Laterality Date   calf implants     COLONOSCOPY     ENDOSCOPIC RETROGRADE CHOLANGIOPANCREATOGRAPHY (ERCP) WITH PROPOFOL  N/A 07/04/2020   Procedure: ENDOSCOPIC RETROGRADE CHOLANGIOPANCREATOGRAPHY (ERCP) WITH PROPOFOL ;  Surgeon: Saintclair Jasper, MD;  Location: [facility];  Service: Gastroenterology;  Laterality: N/A;   REMOVAL OF STONES  07/04/2020   Procedure: REMOVAL OF STONES;  Surgeon: Saintclair Jasper, MD;  Location: [facility];  Service: Gastroenterology;;   ANNETT  07/04/2020   Procedure: ANNETT;  Surgeon: Saintclair Jasper, MD;  Location: [facility];  Service: Gastroenterology;;    Family History  Problem Relation Age of Onset   Osteoporosis Mother    Macular  degeneration Mother    Cancer Mother        bladder   Alzheimer's disease Mother    Dementia Mother    Parkinson's disease Mother    CAD Father    Hypertension Father    Diabetes Father    Depression Father    Breast cancer Cousin 7   Kidney disease Brother     Social History   Socioeconomic History   Marital status: Married    Spouse name: Not on file   Number of children: 2   Years of education: Not on file   Highest education level: Not on file  Occupational History   Not on file  Tobacco Use   Smoking status: Former    Current packs/day: 0.00    Average packs/day: 2.0 packs/day for 30.0 years (60.0 ttl pk-yrs)  Types: Cigarettes    Start date: 32    Quit date: 2000    Years since quitting: 25.6    Passive exposure: Never   Smokeless tobacco: Never  Vaping Use   Vaping status: Never Used  Substance and Sexual Activity   Alcohol use: Never   Drug use: Never   Sexual activity: Not on file  Other Topics Concern   Not on file  Social History Narrative   Not on file   Social Drivers of Health   Financial Resource Strain: Not on file  Food Insecurity: Low Risk  (05/12/2023)   Received from [facility]   Hunger Vital Sign    Within the past 12 months, you worried that your food would run out before you got money to buy more: Never true    Within the past 12 months, the food you bought just didn't last and you didn't have money to get more. : Never true  Transportation Needs: No Transportation Needs (05/12/2023)   Received from [facility]    In the past 12 months, has lack of reliable transportation kept you from medical appointments, meetings, work or from getting things needed for daily living? : No  Physical Activity: Not on file  Stress: Not on file  Social Connections: Not on file  Intimate Partner Violence: Not on file    Review of Systems  Constitutional:  Negative for fatigue.  Respiratory:  Positive for shortness of breath.    Psychiatric/Behavioral:  Negative for sleep disturbance.     Vitals:   06/01/24 0821  BP: 137/78  Pulse: 78  SpO2: 94%     Physical Exam Constitutional:      Appearance: Normal appearance.  HENT:     Head: Normocephalic.     Nose: Nose normal.     Mouth/Throat:     Mouth: Mucous membranes are moist.  Eyes:     General: No scleral icterus. Cardiovascular:     Rate and Rhythm: Normal rate and regular rhythm.     Heart sounds: No murmur heard.    No friction rub.  Pulmonary:     Effort: No respiratory distress.     Breath sounds: No stridor. No wheezing or rhonchi.  Musculoskeletal:     Cervical back: No rigidity or tenderness.  Neurological:     Mental Status: She is alert.  Psychiatric:        Mood and Affect: Mood normal.    Data Reviewed: Cardiac CT end of June 2025 reviewed by myself showing evidence of emphysema   Assessment/Plan:  Chronic obstructive pulmonary disease/emphysema Past history of smoking Shortness of breath on exertion Chronic cough - Symptoms likely related to obstructive lung disease from smoking previously  Deconditioning may be playing a role in activity level and tolerance  CT showing emphysema was reviewed with the patient during the visit  Inhaler technique was reviewed during the visit-she does have Symbicort available to her  Prescription for Stiolto which is on a preferred list is provided  Sample of Stiolto provided to patient  Rescue inhaler use as needed  She was shown how to use the inhaler  Graded activities as tolerated  Encouraged to exercise regularly to build up endurance  Schedule for pulmonary function test  Follow-up in about 3 months  Encouraged to call with significant concerns  Depending on pulmonary function test, may benefit from pulmonary rehab  Important to make sure she is vaccinated  Other chronic health problems include  restless legs, anxiety/depression, hypothyroidism  Jennet Epley  MD [facility] 06/01/2024, 8:33 AM  CC: Loreli Kins, MD

## 2024-06-15 DIAGNOSIS — M810 Age-related osteoporosis without current pathological fracture: Secondary | ICD-10-CM | POA: Diagnosis not present

## 2024-06-23 DIAGNOSIS — M810 Age-related osteoporosis without current pathological fracture: Secondary | ICD-10-CM | POA: Diagnosis not present

## 2024-06-23 DIAGNOSIS — E039 Hypothyroidism, unspecified: Secondary | ICD-10-CM | POA: Diagnosis not present

## 2024-06-27 ENCOUNTER — Telehealth: Payer: Self-pay | Admitting: Pulmonary Disease

## 2024-06-27 NOTE — Telephone Encounter (Signed)
 PT needs PFT before 09/14/24 OV. Called PT no answer left VM.

## 2024-07-15 ENCOUNTER — Encounter: Payer: Self-pay | Admitting: Cardiology

## 2024-07-15 ENCOUNTER — Ambulatory Visit: Admitting: Cardiology

## 2024-07-15 ENCOUNTER — Ambulatory Visit: Attending: Cardiology | Admitting: Cardiology

## 2024-07-15 VITALS — BP 149/57 | HR 71 | Ht 63.0 in | Wt 134.0 lb

## 2024-07-15 DIAGNOSIS — I2510 Atherosclerotic heart disease of native coronary artery without angina pectoris: Secondary | ICD-10-CM

## 2024-07-15 DIAGNOSIS — Z87891 Personal history of nicotine dependence: Secondary | ICD-10-CM | POA: Diagnosis not present

## 2024-07-15 DIAGNOSIS — I700 Atherosclerosis of aorta: Secondary | ICD-10-CM | POA: Diagnosis not present

## 2024-07-15 DIAGNOSIS — I10 Essential (primary) hypertension: Secondary | ICD-10-CM

## 2024-07-15 DIAGNOSIS — R072 Precordial pain: Secondary | ICD-10-CM | POA: Diagnosis not present

## 2024-07-15 NOTE — Progress Notes (Signed)
 Cardiology Office Note:    Date:  07/15/2024  NAME:  Cheryl Burgess    MRN: [id number] DOB:  Mar 12, 1956   PCP:  Loreli Kins, MD  Former Cardiology Providers: Dr. Claudene Primary Cardiologist:  Madonna Large, DO, [facility] (established care 07/15/2024) Electrophysiologist:  None   Referring MD: Leonel Cole, MD  Reason of Consult: Coronary artery disease  Chief Complaint  Patient presents with   Establish Care   Chest Pain    Coronary calcification    History of Present Illness:    Cheryl Burgess is a 68 y.o. Caucasian female whose past medical history and cardiovascular risk factors includes: Moderate coronary calcification, aortic atherosclerosis, former smoker, COPD. She is being seen today for the evaluation of coronary artery disease at the request of Leonel Cole, MD.  Formally under the care of Dr. Victory Claudene who last saw Cheryl Burgess back in January 2021. I am seeing her for the first time to re-establishing care.   Patient had a coronary calcium  score back in 2021 and was noted to have a total CAC of 57 placing her at the 75th percentile.  She had a repeat coronary calcium  score in July 2025 which reported a total CAC of 255 placing her at the 81st percentile.  Patient presents to the office with concerns for chest pain/discomfort.   Located: Right sided, anterior chest wall, under her right breast. Intensity 7 out of 10. Intermittent. Lasting for a few seconds. Not worse with effort related activities, does not resolve with rest. Self-limited More noticeable at night. She is on omeprazole for heartburn symptoms. Overall intensity frequency and duration has not changed. Patient did not have any discomfort when she last saw Dr. Claudene back in 2021.  However she has cardiovascular risk factors and a strong family history of cardiovascular disease.  She recently had a repeat coronary calcium  score which notes elevated CAC when compared to 2021.  She was placed  on Zetia  by PCP and no follow-up labs have been performed.  She was on Crestor  5 mg daily in the past but discontinued secondary to hip pain  Risk factors for vascular disease include a family history (father had vascular disease/CHF/AICD but lived to be 84; her grandmother on the father's side had heart failure and myocardial infarction), mild hyperlipidemia, prior smoking history   Current Medications: Current Meds  Medication Sig   ABRYSVO 120 MCG/0.5ML injection    albuterol (VENTOLIN HFA) 108 (90 Base) MCG/ACT inhaler Inhale 2 puffs into the lungs.   ALPRAZolam  (XANAX ) 0.25 MG tablet Take 0.25 mg by mouth at bedtime.   bimatoprost (LATISSE) 0.03 % ophthalmic solution APPLY TO UPPER LASHES DAILY FOR 1 MONTH ,THEN TWICE DAILY PER WEEK   buPROPion  (WELLBUTRIN  XL) 150 MG 24 hr tablet Take 150 mg by mouth every morning.   Cholecalciferol (D 1000) 25 MCG (1000 UT) capsule Take 1,000 Units by mouth.   ezetimibe  (ZETIA ) 10 MG tablet Take 10 mg by mouth daily.   gabapentin (NEURONTIN) 600 MG tablet Take 600 mg by mouth 3 (three) times daily.   imipramine  (TOFRANIL ) 25 MG tablet Take one tablet in AM and two at bedtime.   levothyroxine (SYNTHROID) 75 MCG tablet Take 75 mcg by mouth daily before breakfast.   losartan-hydrochlorothiazide (HYZAAR) 100-25 MG tablet Take 1 tablet by mouth daily.   omeprazole (PRILOSEC) 40 MG capsule Take 40 mg by mouth daily.   rOPINIRole (REQUIP) 3 MG tablet Take 3 mg by mouth at bedtime.  Tiotropium Bromide-Olodaterol (STIOLTO RESPIMAT ) 2.5-2.5 MCG/ACT AERS Inhale 2 puffs into the lungs daily.   triamcinolone  cream (KENALOG) 0.1 % Apply 1 Application topically 2 (two) times daily as needed.   zolpidem  (AMBIEN  CR) 12.5 MG CR tablet Take 1 tablet (12.5 mg total) by mouth at bedtime as needed for sleep.     Allergies:    Omeprazole, Rosuvastatin , and Keflex [cephalexin]   Past Medical History: Past Medical History:  Diagnosis Date   Ambien  use disorder, mild,  abuse (HCC)    Anemia, unspecified    Atherosclerosis of aorta    Benzodiazepine abuse (HCC)    COPD (chronic obstructive pulmonary disease) (HCC)    Depression    History of basal cell carcinoma (BCC) 02/18/2024   Right Upper Back (Excised on 03/12/23 Margins Clear)    Hypertension    Hypothyroidism    Insomnia    Menopausal syndrome (hot flashes)    Recurrent UTI    RLS (restless legs syndrome)    Squamous cell carcinoma of skin    Superficial basal cell carcinoma (BCC) 04/19/2013   Post Right Knee (Cx3,5FU)   Thalassemia minor     Past Surgical History: Past Surgical History:  Procedure Laterality Date   calf implants     COLONOSCOPY     ENDOSCOPIC RETROGRADE CHOLANGIOPANCREATOGRAPHY (ERCP) WITH PROPOFOL  N/A 07/04/2020   Procedure: ENDOSCOPIC RETROGRADE CHOLANGIOPANCREATOGRAPHY (ERCP) WITH PROPOFOL ;  Surgeon: Saintclair Jasper, MD;  Location: [facility];  Service: Gastroenterology;  Laterality: N/A;   REMOVAL OF STONES  07/04/2020   Procedure: REMOVAL OF STONES;  Surgeon: Saintclair Jasper, MD;  Location: [facility];  Service: Gastroenterology;;   ANNETT  07/04/2020   Procedure: ANNETT;  Surgeon: Saintclair Jasper, MD;  Location: [facility];  Service: Gastroenterology;;    Social History: Social History   Tobacco Use   Smoking status: Former    Current packs/day: 0.00    Average packs/day: 2.0 packs/day for 30.0 years (60.0 ttl pk-yrs)    Types: Cigarettes    Start date: 70    Quit date: 2000    Years since quitting: 25.7    Passive exposure: Never   Smokeless tobacco: Never  Vaping Use   Vaping status: Never Used  Substance Use Topics   Alcohol use: Never   Drug use: Never    Family History: Family History  Problem Relation Age of Onset   Osteoporosis Mother    Macular degeneration Mother    Cancer Mother        bladder   Alzheimer's disease Mother    Dementia Mother    Parkinson's disease Mother    CAD Father    Hypertension Father    Diabetes  Father    Depression Father    Breast cancer Cousin 69   Kidney disease Brother     ROS:   Review of Systems  Constitutional: Positive for malaise/fatigue.  Cardiovascular:  Positive for chest pain. Negative for claudication, irregular heartbeat, leg swelling, near-syncope, orthopnea, palpitations, paroxysmal nocturnal dyspnea and syncope.  Respiratory:  Negative for shortness of breath.   Hematologic/Lymphatic: Negative for bleeding problem.    EKGs/Labs/Other Studies Reviewed:   EKG: EKG Interpretation Date/Time:  Friday July 15 2024 14:33:42 EDT Ventricular Rate:  71 PR Interval:  202 QRS Duration:  94 QT Interval:  414 QTC Calculation: 449 R Axis:   -21  Text Interpretation: Normal sinus rhythm Left ventricular hypertrophy with repolarization abnormality ( R in aVL , Cornell product ) Nonspecific ST abnormality No previous ECGs available  Confirmed by Michele Richardson [phone]) on 07/15/2024 3:11:37 PM  Echocardiogram: January 2021 LVEF 55 to 60% Grade 2 diastolic dysfunction RV size and function normal. Estimated RAP 3 mmHg  Coronary artery calcium  score: 05/04/2024 Total CAC 255, 81st percentile No acute cardiac findings  Labs: External Labs: Collected: 09/28/2023 [facility] database Total cholesterol 186, triglycerides 53, HDL 79, LDL 97 A1c 5.1. Hemoglobin 10.3 Potassium 3.7 TSH 1.56  Physical Exam:    Today's Vitals   07/15/24 1428  BP: (!) 149/57  Pulse: 71  SpO2: 97%  Weight: 134 lb (60.8 kg)  Height: 5' 3 (1.6 m)   Body mass index is 23.74 kg/m. Wt Readings from Last 3 Encounters:  07/15/24 134 lb (60.8 kg)  06/01/24 127 lb (57.6 kg)  07/04/20 130 lb (59 kg)    Physical Exam  Constitutional: No distress.  hemodynamically stable  Neck: No JVD present.  Cardiovascular: Normal rate, regular rhythm, S1 normal and S2 normal. Exam reveals no gallop, no S3 and no S4.  No murmur heard. Pulmonary/Chest: Effort normal. No stridor. She has no wheezes. She  has no rales.  Decreased breath sounds bilaterally  Musculoskeletal:        General: No edema.     Cervical back: Neck supple.  Skin: Skin is warm.     Impression & Recommendation(s):  Impression:   ICD-10-CM   1. Precordial pain  R07.2 ECHOCARDIOGRAM COMPLETE    MYOCARDIAL PERFUSION IMAGING    Cardiac Stress Test: Informed Consent Details: Physician/Practitioner Attestation; Transcribe to consent form and obtain patient signature    2. Coronary artery disease involving native coronary artery of native heart without angina pectoris  I25.10 EKG 12-Lead    ECHOCARDIOGRAM COMPLETE    MYOCARDIAL PERFUSION IMAGING    Lipid panel    Lipid panel    Cardiac Stress Test: Informed Consent Details: Physician/Practitioner Attestation; Transcribe to consent form and obtain patient signature    3. Aortic atherosclerosis  I70.0     4. Benign hypertension  I10 Comprehensive metabolic panel with GFR    Comprehensive metabolic panel with GFR    5. Former smoker  Z87.891        Recommendation(s):  Precordial pain Coronary artery disease involving native coronary artery of native heart without angina pectoris Precordial discomfort predominantly noncardiac. Risk factors: Strong family history, former smoker, moderate CAC EKG shows a sinus rhythm with nonspecific ST-T changes. No active chest pain. Echo will be ordered to evaluate for structural heart disease and left ventricular systolic function. Exercise nuclear stress test to evaluate for functional capacity and exercise-induced arrhythmia/ischemia Reemphasized importance of improving her modifiable cardiovascular risk factors  Aortic atherosclerosis Hyperlipidemia Currently on Zetia  10 mg p.o. daily. Did not tolerate Crestor  5 mg p.o. daily, in the past due to hip pain. Check fasting lipids. Recommended goal LDL <70 mg/dL  Benign hypertension The patient states home blood pressures are better controlled. Currently on  losartan/hydrochlorothiazide 100/25 mg p.o. daily Reemphasized importance of low-salt diet. Cardiology following peripherally, managed by primary care provider.  Former smoker Endorses continued complete smoking cessation   Orders Placed:  Orders Placed This Encounter  Procedures   Comprehensive metabolic panel with GFR    Standing Status:   Future    Number of Occurrences:   1    Expected Date:   07/22/2024    Expiration Date:   07/15/2025   Lipid panel    Standing Status:   Future    Number of Occurrences:   1  Expected Date:   07/22/2024    Expiration Date:   07/15/2025   Cardiac Stress Test: Informed Consent Details: Physician/Practitioner Attestation; Transcribe to consent form and obtain patient signature    Physician/Practitioner attestation of informed consent for procedure/surgical case:   I, the physician/practitioner, attest that I have discussed with the patient the benefits, risks, side effects, alternatives, likelihood of achieving goals and potential problems during recovery for the procedure that I have provided informed consent.    Procedure:   exercise nuclear stress test    Indication/Reason:   coronary calcification and precordial pain   MYOCARDIAL PERFUSION IMAGING    Standing Status:   Future    Expiration Date:   07/15/2025    Patient weight in lbs:   134    [facility]    Type of stress:   Exercise   EKG 12-Lead   ECHOCARDIOGRAM COMPLETE    Standing Status:   Future    Expiration Date:   07/15/2025    Where should this test be performed:   [facility]    Does the patient weigh less than or greater than 250 lbs?:   Patient weighs less than 250 lbs    Perflutren DEFINITY (image enhancing agent) should be administered unless hypersensitivity or allergy exist:   Administer Perflutren    Reason for exam-Echo:   Other-Full Diagnosis List    Full ICD-10/Reason for Exam:   Precordial pain [786.51.ICD-9-CM]     Full ICD-10/Reason for Exam:   Coronary artery calcification [8805182]     Final Medication List:   No orders of the defined types were placed in this encounter.   Medications Discontinued During This Encounter  Medication Reason   losartan (COZAAR) 100 MG tablet Change in therapy   imipramine  (TOFRANIL -PM) 75 MG capsule Change in therapy     Current Outpatient Medications:    ABRYSVO 120 MCG/0.5ML injection, , Disp: , Rfl:    albuterol (VENTOLIN HFA) 108 (90 Base) MCG/ACT inhaler, Inhale 2 puffs into the lungs., Disp: , Rfl:    ALPRAZolam  (XANAX ) 0.25 MG tablet, Take 0.25 mg by mouth at bedtime., Disp: , Rfl:    bimatoprost (LATISSE) 0.03 % ophthalmic solution, APPLY TO UPPER LASHES DAILY FOR 1 MONTH ,THEN TWICE DAILY PER WEEK, Disp: , Rfl:    buPROPion  (WELLBUTRIN  XL) 150 MG 24 hr tablet, Take 150 mg by mouth every morning., Disp: , Rfl:    Cholecalciferol (D 1000) 25 MCG (1000 UT) capsule, Take 1,000 Units by mouth., Disp: , Rfl:    ezetimibe  (ZETIA ) 10 MG tablet, Take 10 mg by mouth daily., Disp: , Rfl:    gabapentin (NEURONTIN) 600 MG tablet, Take 600 mg by mouth 3 (three) times daily., Disp: , Rfl:    imipramine  (TOFRANIL ) 25 MG tablet, Take one tablet in AM and two at bedtime., Disp: 270 tablet, Rfl: 1   levothyroxine (SYNTHROID) 75 MCG tablet, Take 75 mcg by mouth daily before breakfast., Disp: , Rfl:    losartan-hydrochlorothiazide (HYZAAR) 100-25 MG tablet, Take 1 tablet by mouth daily., Disp: , Rfl:    omeprazole (PRILOSEC) 40 MG capsule, Take 40 mg by mouth daily., Disp: , Rfl:    rOPINIRole (REQUIP) 3 MG tablet, Take 3 mg by mouth at bedtime., Disp: , Rfl:    Tiotropium Bromide-Olodaterol (STIOLTO RESPIMAT ) 2.5-2.5 MCG/ACT AERS, Inhale 2 puffs into the lungs daily., Disp: 1 g, Rfl: 3   triamcinolone  cream (KENALOG) 0.1 %,  Apply 1 Application topically 2 (two) times daily as needed., Disp: , Rfl:    zolpidem  (AMBIEN  CR) 12.5 MG CR tablet, Take 1 tablet (12.5 mg total) by  mouth at bedtime as needed for sleep., Disp: 30 tablet, Rfl: 0  Consent:   Informed Consent   Shared Decision Making/Informed Consent The risks [chest pain, shortness of breath, cardiac arrhythmias, dizziness, blood pressure fluctuations, myocardial infarction, stroke/transient ischemic attack, nausea, vomiting, allergic reaction, radiation exposure, metallic taste sensation and life-threatening complications (estimated to be 1 in 10,000)], benefits (risk stratification, diagnosing coronary artery disease, treatment guidance) and alternatives of a nuclear stress test were discussed in detail with Cheryl Burgess and she agrees to proceed.     Disposition:   1 year follow-up sooner if needed Patient may be asked to follow-up sooner based on the results of the above-mentioned testing.  Her questions and concerns were addressed to her satisfaction. She voices understanding of the recommendations provided during this encounter.    Signed, Madonna Michele HAS, [facility]  A Division of [facility] [address], [place], [place] [address]  07/15/2024 3:50 PM

## 2024-07-15 NOTE — Patient Instructions (Addendum)
 LABS IN 1 WEEK Fasting lipid panel  Cmp    Testing/Procedures: Echocardiogram  Your physician has requested that you have an echocardiogram. Echocardiography is a painless test that uses sound waves to create images of your heart. It provides your doctor with information about the size and shape of your heart and how well your heart's chambers and valves are working. This procedure takes approximately one hour. There are no restrictions for this procedure. Please do NOT wear cologne, perfume, aftershave, or lotions (deodorant is allowed). Please arrive 15 minutes prior to your appointment time.  Please note: We ask at that you not bring children with you during ultrasound (echo/ vascular) testing. Due to room size and safety concerns, children are not allowed in the ultrasound rooms during exams. Our front office staff cannot provide observation of children in our lobby area while testing is being conducted. An adult accompanying a patient to their appointment will only be allowed in the ultrasound room at the discretion of the ultrasound technician under special circumstances. We apologize for any inconvenience.  Exercise nuclear stress test   Your physician has requested that you have en exercise stress myoview. For further information please visit [email or web address] Please follow instruction sheet, as given.   Follow-Up: At [facility], you and your health needs are our priority.  As part of our continuing mission to provide you with exceptional heart care, our providers are all part of one team.  This team includes your primary Cardiologist (physician) and Advanced Practice Providers or APPs (Physician Assistants and Nurse Practitioners) who all work together to provide you with the care you need, when you need it.  Your next appointment:   1 year(s)  Provider:   Madonna Large, DO

## 2024-07-22 DIAGNOSIS — I2510 Atherosclerotic heart disease of native coronary artery without angina pectoris: Secondary | ICD-10-CM | POA: Diagnosis not present

## 2024-07-22 DIAGNOSIS — I10 Essential (primary) hypertension: Secondary | ICD-10-CM | POA: Diagnosis not present

## 2024-07-23 LAB — COMPREHENSIVE METABOLIC PANEL WITH GFR
ALT: 42 IU/L — ABNORMAL HIGH (ref 0–32)
AST: 27 IU/L (ref 0–40)
Albumin: 4.3 g/dL (ref 3.9–4.9)
Alkaline Phosphatase: 86 IU/L (ref 49–135)
BUN/Creatinine Ratio: 16 (ref 12–28)
BUN: 12 mg/dL (ref 8–27)
Bilirubin Total: 0.6 mg/dL (ref 0.0–1.2)
CO2: 26 mmol/L (ref 20–29)
Calcium: 9.3 mg/dL (ref 8.7–10.3)
Chloride: 93 mmol/L — ABNORMAL LOW (ref 96–106)
Creatinine, Ser: 0.75 mg/dL (ref 0.57–1.00)
Globulin, Total: 2.4 g/dL (ref 1.5–4.5)
Glucose: 89 mg/dL (ref 70–99)
Potassium: 4.1 mmol/L (ref 3.5–5.2)
Sodium: 132 mmol/L — ABNORMAL LOW (ref 134–144)
Total Protein: 6.7 g/dL (ref 6.0–8.5)
eGFR: 86 mL/min/1.73 (ref >59)

## 2024-07-23 LAB — LIPID PANEL
Chol/HDL Ratio: 2 ratio (ref 0.0–4.4)
Cholesterol, Total: 152 mg/dL (ref 100–199)
HDL: 75 mg/dL (ref >39)
LDL Chol Calc (NIH): 64 mg/dL (ref 0–99)
Triglycerides: 64 mg/dL (ref 0–149)
VLDL Cholesterol Cal: 13 mg/dL (ref 5–40)

## 2024-07-29 ENCOUNTER — Ambulatory Visit: Payer: Self-pay | Admitting: Cardiology

## 2024-08-03 DIAGNOSIS — F411 Generalized anxiety disorder: Secondary | ICD-10-CM | POA: Diagnosis not present

## 2024-08-22 ENCOUNTER — Telehealth (HOSPITAL_COMMUNITY): Payer: Self-pay

## 2024-08-22 NOTE — Telephone Encounter (Signed)
 Detailed instructions left on the patient's answering machine. S.Tayjah Lobdell CCT

## 2024-08-24 ENCOUNTER — Other Ambulatory Visit: Payer: Self-pay | Admitting: Cardiology

## 2024-08-24 DIAGNOSIS — R072 Precordial pain: Secondary | ICD-10-CM

## 2024-08-24 DIAGNOSIS — I2510 Atherosclerotic heart disease of native coronary artery without angina pectoris: Secondary | ICD-10-CM

## 2024-08-30 ENCOUNTER — Ambulatory Visit (HOSPITAL_COMMUNITY)
Admission: RE | Admit: 2024-08-30 | Discharge: 2024-08-30 | Disposition: A | Source: Ambulatory Visit | Attending: Internal Medicine | Admitting: Internal Medicine

## 2024-08-30 ENCOUNTER — Ambulatory Visit (HOSPITAL_COMMUNITY)
Admission: RE | Admit: 2024-08-30 | Discharge: 2024-08-30 | Disposition: A | Discharge status: Home / Self Care | Source: Ambulatory Visit | Attending: Internal Medicine | Admitting: Internal Medicine

## 2024-08-30 DIAGNOSIS — R072 Precordial pain: Secondary | ICD-10-CM | POA: Insufficient documentation

## 2024-08-30 DIAGNOSIS — I2510 Atherosclerotic heart disease of native coronary artery without angina pectoris: Secondary | ICD-10-CM | POA: Diagnosis not present

## 2024-08-30 LAB — ECHOCARDIOGRAM COMPLETE
Area-P 1/2: 2.61 cm2
S' Lateral: 2.6 cm

## 2024-08-30 MED ORDER — TECHNETIUM TC 99M TETROFOSMIN IV KIT
8.9000 | PACK | Freq: Once | INTRAVENOUS | Status: AC | PRN
  Administered 2024-08-30: 8.9 via INTRAVENOUS

## 2024-08-31 ENCOUNTER — Ambulatory Visit (HOSPITAL_COMMUNITY)
Admission: RE | Admit: 2024-08-31 | Discharge: 2024-08-31 | Disposition: A | Discharge status: Home / Self Care | Source: Ambulatory Visit | Attending: Cardiology | Admitting: Cardiology

## 2024-08-31 DIAGNOSIS — I700 Atherosclerosis of aorta: Secondary | ICD-10-CM | POA: Insufficient documentation

## 2024-08-31 DIAGNOSIS — I2510 Atherosclerotic heart disease of native coronary artery without angina pectoris: Secondary | ICD-10-CM | POA: Insufficient documentation

## 2024-08-31 DIAGNOSIS — R072 Precordial pain: Secondary | ICD-10-CM | POA: Insufficient documentation

## 2024-08-31 LAB — MYOCARDIAL PERFUSION IMAGING
LV dias vol: 57 mL (ref 46–106)
LV sys vol: 20 mL (ref 3.8–5.2)
Nuc Stress EF: 65 %
Peak HR: 87 {beats}/min
Rest HR: 73 {beats}/min
Rest Nuclear Isotope Dose: 8.9 mCi
SDS: 0
SRS: 1
SSS: 0
ST Depression (mm): 0 mm
Stress Nuclear Isotope Dose: 31.7 mCi
TID: 0.95

## 2024-08-31 MED ORDER — REGADENOSON 0.4 MG/5ML IV SOLN
INTRAVENOUS | Status: AC
  Filled 2024-08-31: qty 5

## 2024-08-31 MED ORDER — REGADENOSON 0.4 MG/5ML IV SOLN
0.4000 mg | Freq: Once | INTRAVENOUS | Status: AC
  Administered 2024-08-31: 0.4 mg via INTRAVENOUS

## 2024-08-31 MED ORDER — TECHNETIUM TC 99M TETROFOSMIN IV KIT
31.7000 | PACK | Freq: Once | INTRAVENOUS | Status: AC | PRN
  Administered 2024-08-31: 31.7 via INTRAVENOUS

## 2024-08-31 MED FILL — Regadenoson IV Inj 0.4 MG/5ML (0.08 MG/ML): INTRAVENOUS | Qty: 5 | Status: AC

## 2024-09-08 ENCOUNTER — Ambulatory Visit: Payer: Self-pay | Admitting: Cardiology

## 2024-09-13 DIAGNOSIS — F331 Major depressive disorder, recurrent, moderate: Secondary | ICD-10-CM | POA: Diagnosis not present

## 2024-09-13 DIAGNOSIS — F411 Generalized anxiety disorder: Secondary | ICD-10-CM | POA: Diagnosis not present

## 2024-09-14 ENCOUNTER — Ambulatory Visit: Admitting: Pulmonary Disease

## 2024-09-21 ENCOUNTER — Encounter: Payer: Self-pay | Admitting: Pulmonary Disease

## 2024-09-21 ENCOUNTER — Ambulatory Visit

## 2024-09-21 ENCOUNTER — Ambulatory Visit: Admitting: Pulmonary Disease

## 2024-09-21 VITALS — BP 126/68 | HR 73 | Temp 97.6°F | Ht 63.0 in | Wt 127.6 lb

## 2024-09-21 DIAGNOSIS — F17210 Nicotine dependence, cigarettes, uncomplicated: Secondary | ICD-10-CM

## 2024-09-21 DIAGNOSIS — R0609 Other forms of dyspnea: Secondary | ICD-10-CM

## 2024-09-21 DIAGNOSIS — F419 Anxiety disorder, unspecified: Secondary | ICD-10-CM | POA: Diagnosis not present

## 2024-09-21 DIAGNOSIS — G2581 Restless legs syndrome: Secondary | ICD-10-CM | POA: Diagnosis not present

## 2024-09-21 DIAGNOSIS — J449 Chronic obstructive pulmonary disease, unspecified: Secondary | ICD-10-CM

## 2024-09-21 DIAGNOSIS — E039 Hypothyroidism, unspecified: Secondary | ICD-10-CM

## 2024-09-21 DIAGNOSIS — F32A Depression, unspecified: Secondary | ICD-10-CM | POA: Diagnosis not present

## 2024-09-21 DIAGNOSIS — J432 Centrilobular emphysema: Secondary | ICD-10-CM

## 2024-09-21 DIAGNOSIS — J439 Emphysema, unspecified: Secondary | ICD-10-CM | POA: Diagnosis not present

## 2024-09-21 LAB — PULMONARY FUNCTION TEST
DL/VA % pred: 55 %
DL/VA: 2.3 ml/min/mmHg/L
DLCO unc % pred: 40 %
DLCO unc: 7.56 ml/min/mmHg
FEF 25-75 Post: 0.53 L/s
FEF 25-75 Pre: 0.38 L/s
FEF2575-%Change-Post: 39 %
FEF2575-%Pred-Post: 28 %
FEF2575-%Pred-Pre: 20 %
FEV1-%Change-Post: 14 %
FEV1-%Pred-Post: 44 %
FEV1-%Pred-Pre: 39 %
FEV1-Post: 0.97 L
FEV1-Pre: 0.85 L
FEV1FVC-%Change-Post: 3 %
FEV1FVC-%Pred-Pre: 73 %
FEV6-%Change-Post: 12 %
FEV6-%Pred-Post: 61 %
FEV6-%Pred-Pre: 54 %
FEV6-Post: 1.68 L
FEV6-Pre: 1.5 L
FEV6FVC-%Change-Post: 0 %
FEV6FVC-%Pred-Post: 103 %
FEV6FVC-%Pred-Pre: 102 %
FVC-%Change-Post: 11 %
FVC-%Pred-Post: 58 %
FVC-%Pred-Pre: 53 %
FVC-Post: 1.69 L
FVC-Pre: 1.52 L
Post FEV1/FVC ratio: 58 %
Post FEV6/FVC ratio: 99 %
Pre FEV1/FVC ratio: 56 %
Pre FEV6/FVC Ratio: 98 %
RV % pred: 237 %
RV: 5.07 L
TLC % pred: 134 %
TLC: 6.6 L

## 2024-09-21 MED ORDER — TRELEGY ELLIPTA 100-62.5-25 MCG/ACT IN AEPB: 1.0000 | INHALATION_SPRAY | Freq: Every day | RESPIRATORY_TRACT | 3 refills | Status: AC

## 2024-09-21 NOTE — Patient Instructions (Signed)
 Full pft performed today

## 2024-09-21 NOTE — Progress Notes (Signed)
 Cheryl Burgess    [id number]    1956/08/20  Primary Care Physician:Shaw, Joen, MD  Referring Physician: Neda Jennet LABOR, MD [id number] [place],  [place] [address]  Chief complaint:    Follow-up for shortness of breath, obstructive lung disease   HPI:  Has been doing relatively well since the last visit  Shortness of breath on exertion Limited with activities of daily living  2 pack-a-day smoker for over 30 years in total Office working the past  Occasional cough with sputum production  Not feeling acutely ill at present  Has not been on any inhalers since the last visit  Most activities make her short of breath  Was prescribed Symbicort, Stiolto, not covered by insurance We will tries Trelegy  History of restless legs, depression, hypothyroidism, generalized anxiety disorder   Outpatient Encounter Medications as of 09/21/2024  Medication Sig   Abaloparatide (TYMLOS) 3120 MCG/1.56ML SOPN Inject 1 Dose into the skin daily.   ABRYSVO 120 MCG/0.5ML injection    albuterol (VENTOLIN HFA) 108 (90 Base) MCG/ACT inhaler Inhale 2 puffs into the lungs.   albuterol (VENTOLIN HFA) 108 (90 Base) MCG/ACT inhaler Inhale 2 puffs into the lungs every 6 (six) hours as needed for wheezing or shortness of breath.   ALPRAZolam  (XANAX ) 0.25 MG tablet Take 0.25 mg by mouth at bedtime.   bimatoprost (LATISSE) 0.03 % ophthalmic solution APPLY TO UPPER LASHES DAILY FOR 1 MONTH ,THEN TWICE DAILY PER WEEK   buPROPion  (WELLBUTRIN  XL) 150 MG 24 hr tablet Take 150 mg by mouth every morning.   Cholecalciferol (D 1000) 25 MCG (1000 UT) capsule Take 1,000 Units by mouth.   ezetimibe  (ZETIA ) 10 MG tablet Take 10 mg by mouth daily.   Fluticasone-Umeclidin-Vilant (TRELEGY ELLIPTA ) 100-62.5-25 MCG/ACT AEPB Inhale 1 puff into the lungs daily.   gabapentin (NEURONTIN) 600 MG tablet Take 600 mg by mouth 3 (three) times daily.   imipramine  (TOFRANIL ) 25 MG tablet Take one  tablet in AM and two at bedtime.   levothyroxine (SYNTHROID) 75 MCG tablet Take 75 mcg by mouth daily before breakfast.   losartan-hydrochlorothiazide (HYZAAR) 100-25 MG tablet Take 1 tablet by mouth daily.   omeprazole (PRILOSEC) 40 MG capsule Take 40 mg by mouth daily.   rOPINIRole (REQUIP) 3 MG tablet Take 3 mg by mouth at bedtime.   triamcinolone  cream (KENALOG) 0.1 % Apply 1 Application topically 2 (two) times daily as needed.   zolpidem  (AMBIEN ) 5 MG tablet Take 5 mg by mouth at bedtime as needed for sleep.   [DISCONTINUED] Tiotropium Bromide-Olodaterol (STIOLTO RESPIMAT ) 2.5-2.5 MCG/ACT AERS Inhale 2 puffs into the lungs daily.   [DISCONTINUED] zolpidem  (AMBIEN  CR) 12.5 MG CR tablet Take 1 tablet (12.5 mg total) by mouth at bedtime as needed for sleep.   No facility-administered encounter medications on file as of 09/21/2024.    Allergies as of 09/21/2024 - Review Complete 09/21/2024  Allergen Reaction Noted   Omeprazole  07/07/2024   Rosuvastatin  Other (See Comments) 05/09/2024   Keflex [cephalexin] Itching and Rash 03/30/2019    Past Medical History:  Diagnosis Date   Ambien  use disorder, mild, abuse (HCC)    Anemia, unspecified    Atherosclerosis of aorta    Benzodiazepine abuse (HCC)    COPD (chronic obstructive pulmonary disease) (HCC)    Depression    History of basal cell carcinoma (BCC) 02/18/2024   Right Upper Back (Excised on 03/12/23 Margins Clear)  Hypertension    Hypothyroidism    Insomnia    Menopausal syndrome (hot flashes)    Recurrent UTI    RLS (restless legs syndrome)    Squamous cell carcinoma of skin    Superficial basal cell carcinoma (BCC) 04/19/2013   Post Right Knee (Cx3,5FU)   Thalassemia minor     Past Surgical History:  Procedure Laterality Date   calf implants     COLONOSCOPY     ENDOSCOPIC RETROGRADE CHOLANGIOPANCREATOGRAPHY (ERCP) WITH PROPOFOL  N/A 07/04/2020   Procedure: ENDOSCOPIC RETROGRADE CHOLANGIOPANCREATOGRAPHY (ERCP) WITH  PROPOFOL ;  Surgeon: Saintclair Jasper, MD;  Location: [facility];  Service: Gastroenterology;  Laterality: N/A;   REMOVAL OF STONES  07/04/2020   Procedure: REMOVAL OF STONES;  Surgeon: Saintclair Jasper, MD;  Location: [facility];  Service: Gastroenterology;;   ANNETT  07/04/2020   Procedure: ANNETT;  Surgeon: Saintclair Jasper, MD;  Location: [facility];  Service: Gastroenterology;;    Family History  Problem Relation Age of Onset   Osteoporosis Mother    Macular degeneration Mother    Cancer Mother        bladder   Alzheimer's disease Mother    Dementia Mother    Parkinson's disease Mother    CAD Father    Hypertension Father    Diabetes Father    Depression Father    Breast cancer Cousin 40   Kidney disease Brother     Social History   Socioeconomic History   Marital status: Married    Spouse name: Not on file   Number of children: 2   Years of education: Not on file   Highest education level: Not on file  Occupational History   Not on file  Tobacco Use   Smoking status: Former    Current packs/day: 0.00    Average packs/day: 2.0 packs/day for 30.0 years (60.0 ttl pk-yrs)    Types: Cigarettes    Start date: 38    Quit date: 2000    Years since quitting: 25.9    Passive exposure: Never   Smokeless tobacco: Never  Vaping Use   Vaping status: Never Used  Substance and Sexual Activity   Alcohol use: Never   Drug use: Never   Sexual activity: Not on file  Other Topics Concern   Not on file  Social History Narrative   Not on file   Social Drivers of Health   Tobacco Use: Medium Risk (09/21/2024)   Patient History    Smoking Tobacco Use: Former    Smokeless Tobacco Use: Never    Passive Exposure: Never  Physicist, Medical Strain: Not on file  Food Insecurity: Low Risk (05/12/2023)   Received from [facility]   Epic    Within the past 12 months, you worried that your food would run out before you got money to buy more: Never true    Within the past  12 months, the food you bought just didn't last and you didn't have money to get more. : Never true  Transportation Needs: No Transportation Needs (05/12/2023)   Received from [facility]    In the past 12 months, has lack of reliable transportation kept you from medical appointments, meetings, work or from getting things needed for daily living? : No  Physical Activity: Not on file  Stress: Not on file  Social Connections: Not on file  Intimate Partner Violence: Not on file  Depression (EYV7-0): Not on file  Alcohol Screen: Not on file  Housing: Low  Risk (07/07/2023)   Received from [facility]   Epic    What is your living situation today?: I have a steady place to live    Think about the place you live. Do you have problems with any of the following? Choose all that apply:: Not on file  Utilities: Low Risk (05/12/2023)   Received from [facility]   Utilities    In the past 12 months has the electric, gas, oil, or water company threatened to shut off services in your home? : No  Health Literacy: Not on file    Review of Systems  Constitutional:  Negative for fatigue.  Respiratory:  Positive for cough and shortness of breath.   Psychiatric/Behavioral:  Negative for sleep disturbance.     Vitals:   09/21/24 1353  BP: 126/68  Pulse: 73  Temp: 97.6 F (36.4 C)  SpO2: 96%     Physical Exam Constitutional:      Appearance: Normal appearance.  HENT:     Head: Normocephalic.     Nose: Nose normal.     Mouth/Throat:     Mouth: Mucous membranes are moist.  Eyes:     General: No scleral icterus. Cardiovascular:     Rate and Rhythm: Normal rate and regular rhythm.     Heart sounds: No murmur heard.    No friction rub.  Pulmonary:     Effort: No respiratory distress.     Breath sounds: No stridor. No wheezing or rhonchi.  Musculoskeletal:     Cervical back: No rigidity or tenderness.  Neurological:     General: No focal deficit present.     Mental  Status: She is alert.  Psychiatric:        Mood and Affect: Mood normal.    Data Reviewed: Cardiac CT end of June 2025 reviewed by myself showing evidence of emphysema  PFT was reviewed with the patient today showing severe obstructive lung disease with air trapping, severely reduced diffusing capacity   Assessment/Plan:  Gold 3 COPD  Shortness of breath on exertion  Significant deconditioning  CT scan with extensive emphysema  Will prescribe Trelegy - Inhaler technique was discussed with patient - Encouraged to watch some videos on how to use the inhalers properly as well - Need to rinse regularly to ensure she does not develop thrush  Albuterol as needed  She will benefit from cardiopulmonary rehab -We will set her up with [facility] an online program  Extra to make sure she is vaccinated  Restless legs  Anxiety/depression  Hypothyroidism   Follow-up appointment in 3 months  I personally spent a total of 31 minutes in the care of the patient today including preparing to see the patient, getting/reviewing separately obtained history, performing a medically appropriate exam/evaluation, counseling and educating, placing orders, documenting clinical information in the EHR, and independently interpreting results.   [facility] 09/21/2024, 2:19 PM  CC: Neda Jennet LABOR, MD

## 2024-09-21 NOTE — Patient Instructions (Signed)
 Your breathing study does show severe obstructive disease with air trapping  I am placing a prescription for Trelegy to be used 1 puff daily - Make sure you rinse your mouth after use - Try to use the inhaler about the same time every day  Second inhaler is albuterol to be used as needed can be used 2 puffs up to 4 times a day  Make sure you watch a couple of videos to read to read out to use it properly  Will place a referral for pulmonary rehab -It is an online rehab program for COPD  Make sure you start yourself exercising on a regular basis  I will see you back in about 3 months  Call us  with significant concerns

## 2024-09-21 NOTE — Progress Notes (Signed)
 Full pft performed today

## 2024-12-28 ENCOUNTER — Ambulatory Visit: Admitting: Pulmonary Disease

## 2025-02-21 ENCOUNTER — Ambulatory Visit: Admitting: Dermatology
